# Patient Record
Sex: Female | Born: 1940 | Race: White | Hispanic: No | Smoking: Former smoker
Health system: Southern US, Community
[De-identification: ages and names within clinical notes are randomized; demographics above are authoritative.]

## PROBLEM LIST (undated history)

## (undated) DIAGNOSIS — E785 Hyperlipidemia, unspecified: Secondary | ICD-10-CM

## (undated) DIAGNOSIS — C569 Malignant neoplasm of unspecified ovary: Secondary | ICD-10-CM

## (undated) DIAGNOSIS — I2510 Atherosclerotic heart disease of native coronary artery without angina pectoris: Secondary | ICD-10-CM

## (undated) DIAGNOSIS — C539 Malignant neoplasm of cervix uteri, unspecified: Secondary | ICD-10-CM

## (undated) HISTORY — DX: Malignant neoplasm of cervix uteri, unspecified: C53.9

## (undated) HISTORY — DX: Hyperlipidemia, unspecified: E78.5

## (undated) HISTORY — DX: Atherosclerotic heart disease of native coronary artery without angina pectoris: I25.10

## (undated) HISTORY — DX: Malignant neoplasm of unspecified ovary: C56.9

---

## 1974-09-24 HISTORY — PX: TOTAL ABDOMINAL HYSTERECTOMY: SHX209

## 1999-08-14 ENCOUNTER — Ambulatory Visit (HOSPITAL_COMMUNITY): Admission: RE | Admit: 1999-08-14 | Discharge: 1999-08-14 | Payer: Self-pay | Admitting: Obstetrics & Gynecology

## 2001-07-22 ENCOUNTER — Encounter: Payer: Self-pay | Admitting: Internal Medicine

## 2001-07-22 ENCOUNTER — Ambulatory Visit (HOSPITAL_COMMUNITY): Admission: RE | Admit: 2001-07-22 | Discharge: 2001-07-22 | Payer: Self-pay | Admitting: Internal Medicine

## 2002-01-13 ENCOUNTER — Ambulatory Visit (HOSPITAL_COMMUNITY): Admission: RE | Admit: 2002-01-13 | Discharge: 2002-01-13 | Payer: Self-pay | Admitting: Internal Medicine

## 2002-01-13 ENCOUNTER — Encounter: Payer: Self-pay | Admitting: Internal Medicine

## 2002-10-14 ENCOUNTER — Ambulatory Visit (HOSPITAL_COMMUNITY): Admission: RE | Admit: 2002-10-14 | Discharge: 2002-10-14 | Payer: Self-pay | Admitting: Internal Medicine

## 2002-10-14 ENCOUNTER — Encounter: Payer: Self-pay | Admitting: Internal Medicine

## 2003-11-17 ENCOUNTER — Ambulatory Visit (HOSPITAL_COMMUNITY): Admission: RE | Admit: 2003-11-17 | Discharge: 2003-11-17 | Payer: Self-pay | Admitting: Internal Medicine

## 2005-02-02 ENCOUNTER — Ambulatory Visit (HOSPITAL_COMMUNITY): Admission: RE | Admit: 2005-02-02 | Discharge: 2005-02-02 | Payer: Self-pay | Admitting: Internal Medicine

## 2005-12-27 ENCOUNTER — Ambulatory Visit (HOSPITAL_COMMUNITY): Admission: RE | Admit: 2005-12-27 | Discharge: 2005-12-27 | Payer: Self-pay | Admitting: Urology

## 2006-02-15 ENCOUNTER — Ambulatory Visit (HOSPITAL_COMMUNITY): Admission: RE | Admit: 2006-02-15 | Discharge: 2006-02-15 | Payer: Self-pay | Admitting: Internal Medicine

## 2006-12-06 ENCOUNTER — Ambulatory Visit (HOSPITAL_COMMUNITY): Admission: RE | Admit: 2006-12-06 | Discharge: 2006-12-06 | Payer: Self-pay | Admitting: Internal Medicine

## 2007-02-18 ENCOUNTER — Ambulatory Visit (HOSPITAL_COMMUNITY): Admission: RE | Admit: 2007-02-18 | Discharge: 2007-02-18 | Payer: Self-pay | Admitting: Internal Medicine

## 2007-12-12 ENCOUNTER — Ambulatory Visit (HOSPITAL_COMMUNITY): Admission: RE | Admit: 2007-12-12 | Discharge: 2007-12-12 | Payer: Self-pay | Admitting: Internal Medicine

## 2008-03-16 ENCOUNTER — Ambulatory Visit (HOSPITAL_COMMUNITY): Admission: RE | Admit: 2008-03-16 | Discharge: 2008-03-16 | Payer: Self-pay | Admitting: Internal Medicine

## 2009-04-07 ENCOUNTER — Ambulatory Visit (HOSPITAL_COMMUNITY): Admission: RE | Admit: 2009-04-07 | Discharge: 2009-04-07 | Payer: Self-pay | Admitting: Internal Medicine

## 2009-09-24 HISTORY — PX: ORIF WRIST FRACTURE: SHX2133

## 2010-03-02 ENCOUNTER — Ambulatory Visit: Payer: Self-pay | Admitting: Orthopedic Surgery

## 2010-03-02 ENCOUNTER — Inpatient Hospital Stay (HOSPITAL_COMMUNITY): Admission: EM | Admit: 2010-03-02 | Discharge: 2010-03-04 | Payer: Self-pay | Admitting: Emergency Medicine

## 2010-03-03 ENCOUNTER — Encounter: Payer: Self-pay | Admitting: Orthopedic Surgery

## 2010-03-04 ENCOUNTER — Encounter: Payer: Self-pay | Admitting: Orthopedic Surgery

## 2010-03-08 ENCOUNTER — Ambulatory Visit: Payer: Self-pay | Admitting: Orthopedic Surgery

## 2010-03-08 DIAGNOSIS — S62109A Fracture of unspecified carpal bone, unspecified wrist, initial encounter for closed fracture: Secondary | ICD-10-CM | POA: Insufficient documentation

## 2010-03-13 ENCOUNTER — Encounter (HOSPITAL_COMMUNITY): Admission: RE | Admit: 2010-03-13 | Discharge: 2010-04-12 | Payer: Self-pay | Admitting: Orthopedic Surgery

## 2010-03-13 ENCOUNTER — Encounter: Payer: Self-pay | Admitting: Infectious Diseases

## 2010-03-15 ENCOUNTER — Encounter: Payer: Self-pay | Admitting: Orthopedic Surgery

## 2010-03-29 ENCOUNTER — Ambulatory Visit: Payer: Self-pay | Admitting: Orthopedic Surgery

## 2010-04-07 ENCOUNTER — Encounter: Payer: Self-pay | Admitting: Thoracic Surgery (Cardiothoracic Vascular Surgery)

## 2010-04-07 ENCOUNTER — Ambulatory Visit: Payer: Self-pay | Admitting: Thoracic Surgery (Cardiothoracic Vascular Surgery)

## 2010-04-07 ENCOUNTER — Inpatient Hospital Stay (HOSPITAL_COMMUNITY): Admission: EM | Admit: 2010-04-07 | Discharge: 2010-04-13 | Payer: Self-pay | Admitting: Cardiovascular Disease

## 2010-04-07 HISTORY — PX: CARDIAC CATHETERIZATION: SHX172

## 2010-04-07 HISTORY — PX: CORONARY ARTERY BYPASS GRAFT: SHX141

## 2010-04-10 ENCOUNTER — Telehealth: Payer: Self-pay | Admitting: Orthopedic Surgery

## 2010-04-27 ENCOUNTER — Ambulatory Visit: Payer: Self-pay | Admitting: Orthopedic Surgery

## 2010-05-01 ENCOUNTER — Ambulatory Visit: Payer: Self-pay | Admitting: Thoracic Surgery (Cardiothoracic Vascular Surgery)

## 2010-05-01 ENCOUNTER — Encounter
Admission: RE | Admit: 2010-05-01 | Discharge: 2010-05-01 | Payer: Self-pay | Admitting: Thoracic Surgery (Cardiothoracic Vascular Surgery)

## 2010-05-31 ENCOUNTER — Ambulatory Visit: Payer: Self-pay | Admitting: Orthopedic Surgery

## 2010-06-12 ENCOUNTER — Encounter (HOSPITAL_COMMUNITY): Admission: RE | Admit: 2010-06-12 | Discharge: 2010-06-23 | Payer: Self-pay | Admitting: Cardiovascular Disease

## 2010-06-24 ENCOUNTER — Encounter (HOSPITAL_COMMUNITY)
Admission: RE | Admit: 2010-06-24 | Discharge: 2010-07-24 | Payer: Self-pay | Source: Home / Self Care | Admitting: Cardiovascular Disease

## 2010-07-26 ENCOUNTER — Encounter (HOSPITAL_COMMUNITY)
Admission: RE | Admit: 2010-07-26 | Discharge: 2010-08-25 | Payer: Self-pay | Source: Home / Self Care | Admitting: Cardiovascular Disease

## 2010-08-25 ENCOUNTER — Encounter (HOSPITAL_COMMUNITY)
Admission: RE | Admit: 2010-08-25 | Discharge: 2010-09-24 | Payer: Self-pay | Source: Home / Self Care | Attending: Cardiovascular Disease | Admitting: Cardiovascular Disease

## 2010-08-31 ENCOUNTER — Ambulatory Visit: Payer: Self-pay | Admitting: Orthopedic Surgery

## 2010-09-09 IMAGING — CR DG CHEST 1V PORT
1 series · 1 of 1 positions shown · non-contrast
Comparison: 04/09/2010

CLINICAL DATA: Post CABG

PORTABLE CHEST - 1 VIEW

[AP]
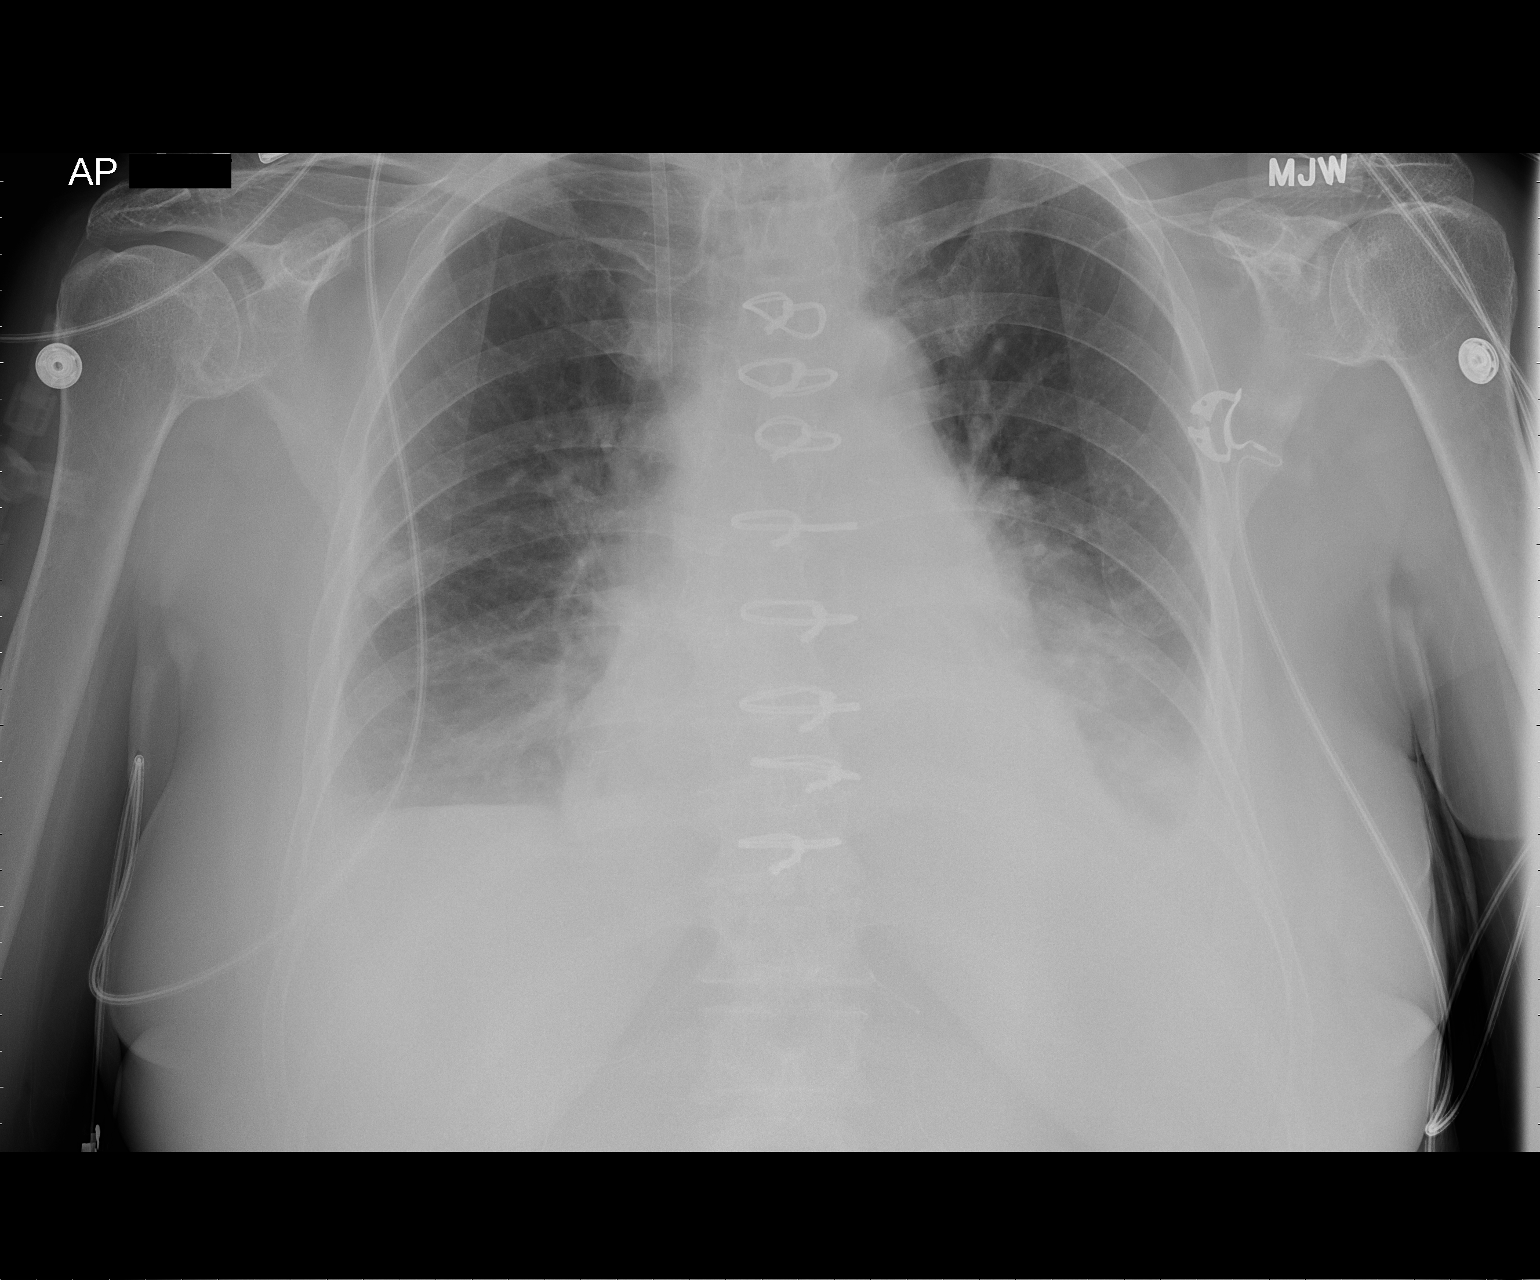

[1 of 1 positions shown; findings below may reference images not displayed]

FINDINGS: Heart size within normal limits.  No significant vascular
congestion.  No pneumothorax.  Bibasilar atelectasis and pleural
reaction about the same.  Overall, no significant change.
IMPRESSION: Postoperative atelectasis - no significant change.

## 2010-09-10 IMAGING — CR DG CHEST 2V
2 series · 2 of 2 positions shown · non-contrast
Comparison: 04/10/2010 and earlier.

CLINICAL DATA: 69-year-old female with ST elevation myocardial
infarction.

CHEST - 2 VIEW

[w chest pa]
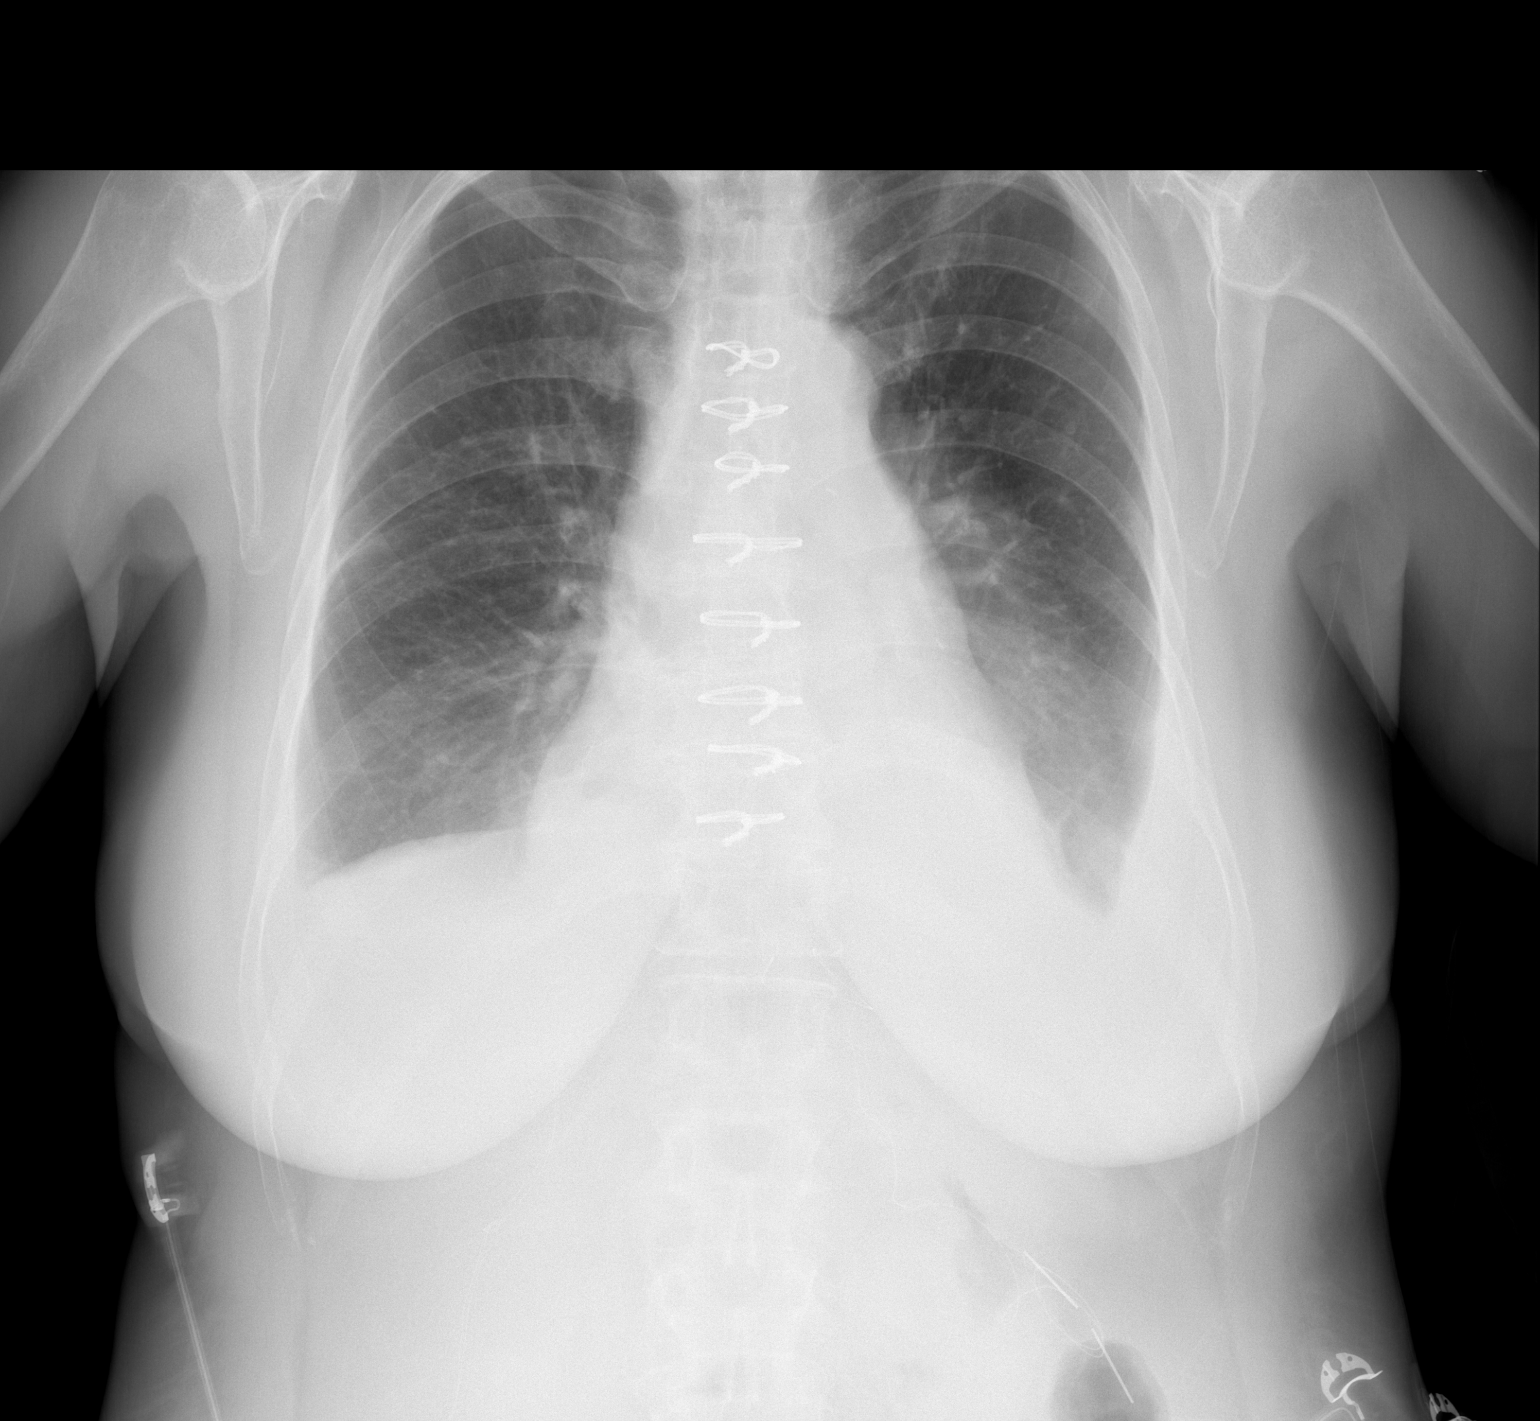

[w chest lat]
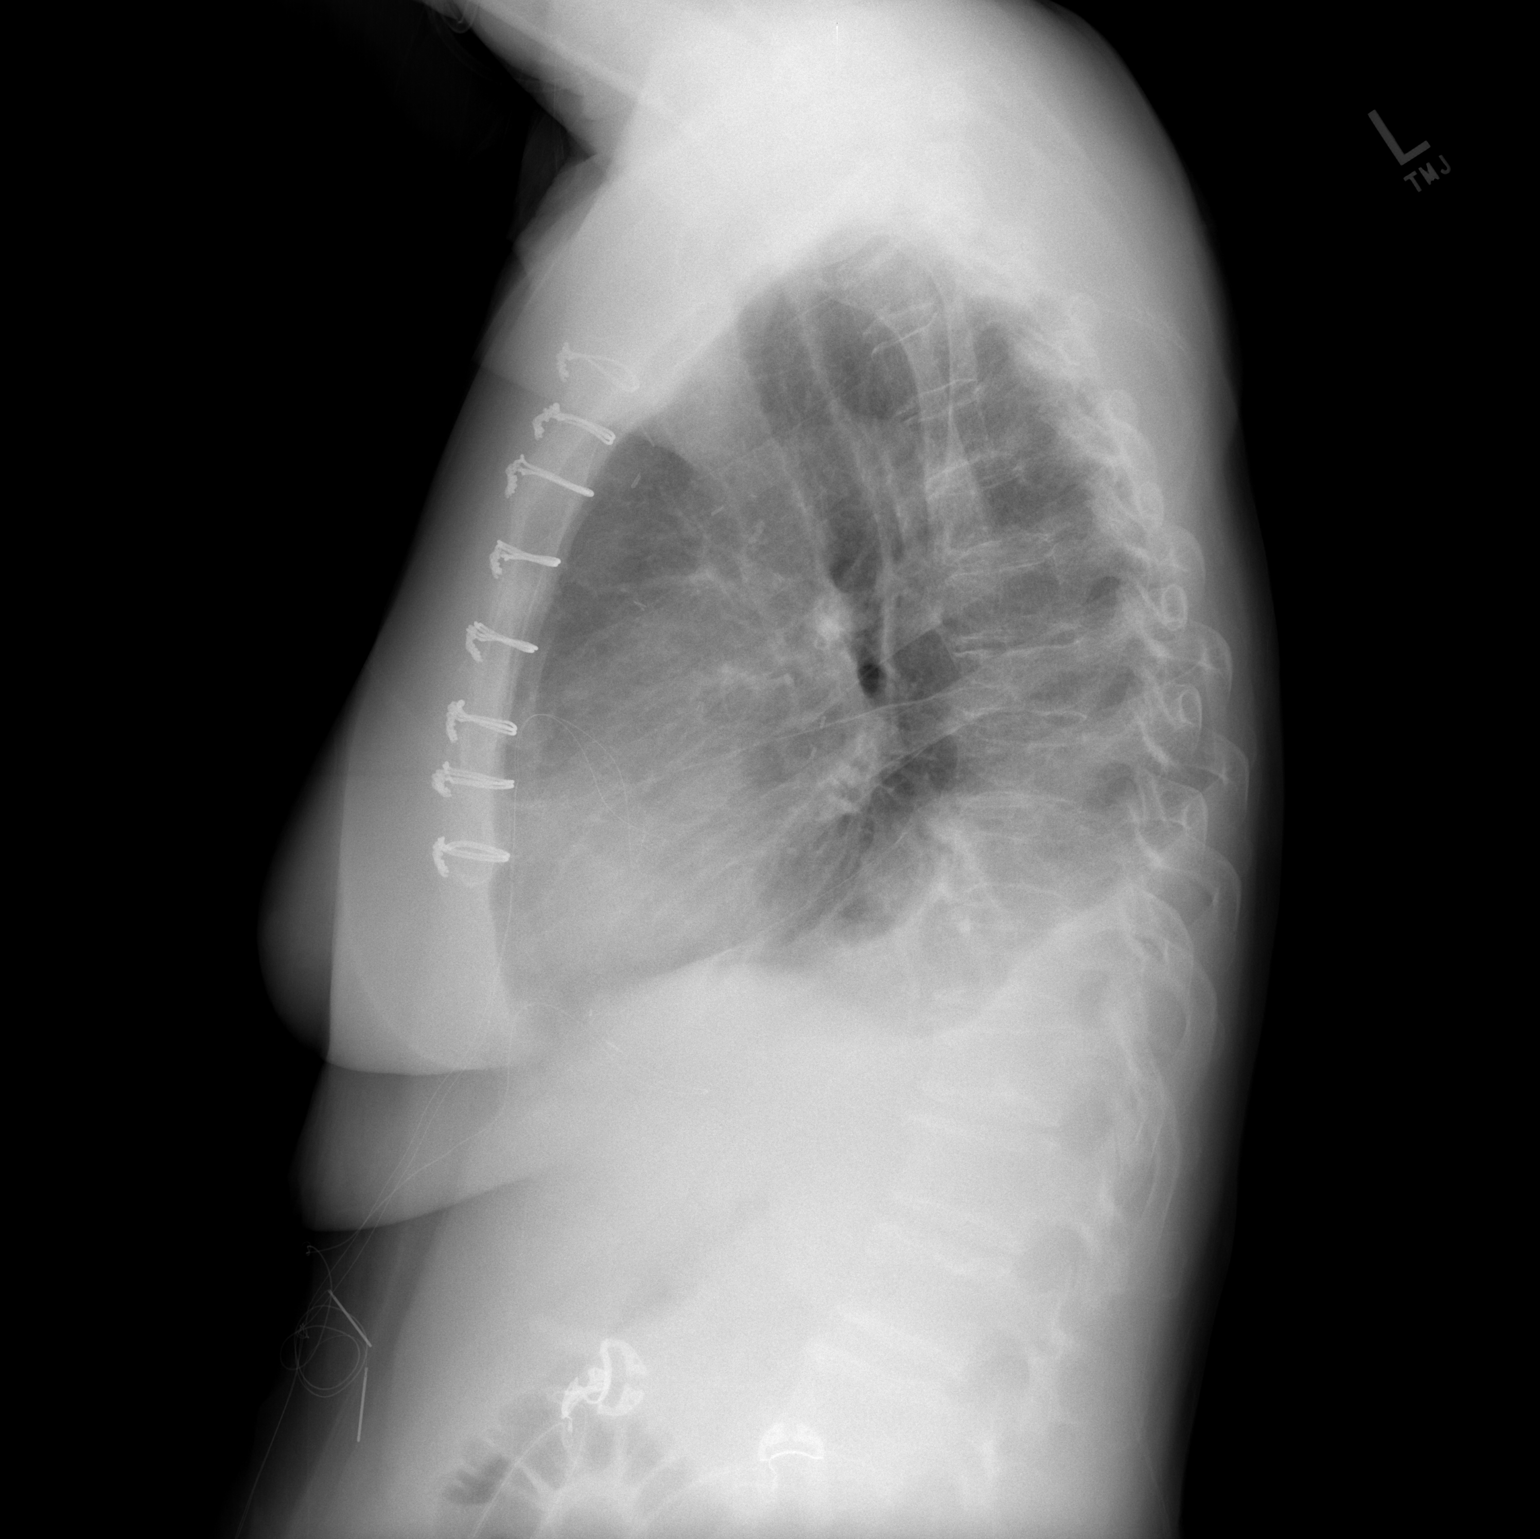

[2 of 2 positions shown; findings below may reference images not displayed]

FINDINGS: Right IJ introducer sheath removed.  A small moderate
bilateral pleural effusions.  Bilateral lower lobe collapse or
consolidation.  Stable cardiac size and mediastinal contours.  No
pneumothorax or pulmonary edema. Stable visualized osseous
structures.
IMPRESSION: Bilateral pleural effusions with lower lobe collapse or
consolidation.

## 2010-10-24 NOTE — Progress Notes (Signed)
Summary: patient in the hospital  Phone Note Other Incoming   Summary of Call: Amy Finley (10/13/2040) [name], [place] boyfriend came by to cancel Amy Finley's appointment for 04/13/10.  Said she has had a heart attack, is in the hospital and will reschedule when she is able to come in. Initial call taken by: Jacklynn Ganong,  April 10, 2010 11:36 AM

## 2010-10-24 NOTE — Letter (Signed)
Summary: Hospital discharge instructions  Hospital discharge instructions   Imported By: Jacklynn Ganong 03/14/2010 10:18:51  _____________________________________________________________________  External Attachment:    Type:   Image     Comment:   External Document

## 2010-10-24 NOTE — Assessment & Plan Note (Signed)
Summary: HOSP FOL-UP POST OP #1 LEFT WRIST/[facility]   Visit Type:  Follow-up  CC:  post op 1 left wrist.  History of Present Illness: I saw Amy Finley in the office today for a followup visit.  She is a 70 years old woman with the complaint of:  fu from hospital, left wrist.  DOS 03/03/10 otif, left wrist with DVR plate. and some additional bone graft  DOI 03/02/10.  POD 5 dressing change and application of splint.  Meds: Norco 5 for pain and Ibuprofen 400, does not need Norco.  doing well wound looks good. Her hand looks good. Recommend resting splint followup for staple removal. After that 2 weeks later. X-ray wrist   Impression & Recommendations:  Problem # 1:  CLOSED FRACTURE OF OTHER BONE OF WRIST (ICD-814.09) Assessment Comment Only  Orders: Physical Therapy Referral (PT) Post-Op Check (33295)  Patient Instructions: 1)  hand splint at [facility] 2)  return on 22nd staple removal  [then f/u 2 weeks after that forxrays]

## 2010-10-24 NOTE — Miscellaneous (Signed)
Summary: came in for staple removal  Clinical Lists Changes  patient came in had staples removed, post op wrist, no swelling,no redness, no drainage, still no pain, has volar splint on, no problems, pt will come back 03/29/10 845 for appt with xrays left wrist.

## 2010-10-24 NOTE — Assessment & Plan Note (Signed)
Summary: RE-CK/XRAY LT WRIST/POST OP 03/03/10/[facility]   Visit Type:  Follow-up  CC:  left wrist fracture.  History of Present Illness: 70 year old female postop day number 56 [ 8 weeks]  DOS 03/03/10 otif, left wrist with DVR plate. and some additional bone graft  DOI 03/02/10.  ROS: had CABG AND MI SINCE WE SAW HER !  OTHERWISE SHE IS DOING WELL   I would like to see some more wrist extension   Xrays show  alignment nearly anatomic  hardware position is great   OT at home  f/u in 1 month      Other Orders: Post-Op Check (98119)  Patient Instructions: 1)  You are doing good 2)  xrays look good 3)  Keep doing wrist exercises 4)  1 month check ROM

## 2010-10-24 NOTE — Assessment & Plan Note (Signed)
Summary: RE-CK/XRAYS LT WRIST/POST OP 03/03/10/[facility]   Visit Type:  Follow-up  CC:  post op fx care lft wrist.  History of Present Illness: I saw Amy Finley in the office today for a followup visit.  She is a 70 years old woman with the complaint of:  fu from hospital, left wrist.  DOS 03/03/10 otif, left wrist with DVR plate. and some additional bone graft  DOI 03/02/10.  POD 26  Today is 2 week recheck xray left wrist after removable splint application.  No pain, only some swelling.  Ibuprofen at night.  Had dissolvable suture showing, so it was clipped today.  She is doing well only takes ibuprofen at night x-rays show slight change in position of the fracture vs. the initial reduction in the OR but no change from the final reduction in the OR.  Otherwise skin looks good hand function is very good with full flexion of the MP joints and IP joints.  We did apply some bone graft followed like her in her splint for 6 weeks total        Impression & Recommendations:  Problem # 1:  CLOSED FRACTURE OF OTHER BONE OF WRIST (ICD-814.09)  2 views LEFT wrist fracture fixation with a DVR plate hole is also fracture but well aligned the hardware and overall fracture alignment looks acceptable  Orders: Post-Op Check (45409) Wrist x-ray complete, minimum 3 views (81191)  Patient Instructions: 1)  21st of July xrays again  2)  wear splint move fingers

## 2010-10-24 NOTE — Assessment & Plan Note (Signed)
Summary: 3 M RE-CK/POST OP WRIST SURG 02/1010/[facility]    Visit Type:  Follow-up  CC:  RECHECK WRIST.  History of Present Illness: I saw Amy Finley in the office today for a 1 month followup visit.  She is a 70 years old woman with the complaint of:  left wrist.  Check ROM. at 6 months post op  DOS 03/03/10 otif, left wrist with DVR plate. and some additional bone graft  DOI 03/02/10.  Doing wonderful.  No meds needed for pain.  Ecotrin 325 per day, Coreg, Crestor.  She has near normal flexion, extension compared to her opposite side. She has normal rotation and pronation, supination. No pain a well-healed scar. Excellent grip strength. She is advised to do activities as tolerated and follow up as needed    Other Orders: Est. Patient Level II (16109)  Patient Instructions: 1)  Please schedule a follow-up appointment as needed.   Orders Added: 1)  Est. Patient Level II [60454]

## 2010-10-24 NOTE — Letter (Signed)
Summary: Discharge Summary  Discharge Summary   Imported By: Cammie Sickle 03/08/2010 14:11:56  _____________________________________________________________________  External Attachment:    Type:   Image     Comment:   External Document

## 2010-10-24 NOTE — Op Note (Signed)
Summary: Operative Rpt LTwrist OTIF  Operative Rpt LTwrist OTIF   Imported By: Cammie Sickle 03/08/2010 14:10:04  _____________________________________________________________________  External Attachment:    Type:   Image     Comment:   External Document

## 2010-10-24 NOTE — Assessment & Plan Note (Signed)
Summary: 1 M RE-CK ROM WRIST/POST OP 03/03/10/[facility]   Visit Type:  Follow-up  CC:  left wrist.  History of Present Illness: I saw Amy Finley in the office today for a 1 month followup visit.  She is a 70 years old woman with the complaint of:  left wrist.  Check ROM.  DOS 03/03/10 otif, left wrist with DVR plate. and some additional bone graft  DOI 03/02/10.  It is now 3 months since her DVR plate and she's doing well except for a slight loss of extension about 10.  She has excellent supination pronation no pain the incision looks good her finger flexion is normal.  She has good grip strength.  Recommend work on exercises including the hand to hand wrist extension exercise followup for final check in 3 months    Other Orders: Post-Op Check (16109)  Patient Instructions: 1)  Your hand and wrist motion is good 2)  Keep working on the exercises 3)  come back in 3 months

## 2010-10-24 NOTE — Miscellaneous (Signed)
Summary: OT clinical evaluation  OT clinical evaluation   Imported By: Jacklynn Ganong 03/31/2010 07:40:18  _____________________________________________________________________  External Attachment:    Type:   Image     Comment:   External Document

## 2010-12-09 LAB — CBC
HCT: 31 % — ABNORMAL LOW (ref 36.0–46.0)
HCT: 32.8 % — ABNORMAL LOW (ref 36.0–46.0)
Hemoglobin: 10.8 g/dL — ABNORMAL LOW (ref 12.0–15.0)
Hemoglobin: 11.3 g/dL — ABNORMAL LOW (ref 12.0–15.0)
Hemoglobin: 11.4 g/dL — ABNORMAL LOW (ref 12.0–15.0)
MCH: 29.5 pg (ref 26.0–34.0)
MCH: 30.1 pg (ref 26.0–34.0)
MCH: 30.2 pg (ref 26.0–34.0)
MCH: 30.5 pg (ref 26.0–34.0)
MCH: 30.9 pg (ref 26.0–34.0)
MCHC: 34.4 g/dL (ref 30.0–36.0)
MCHC: 34.5 g/dL (ref 30.0–36.0)
MCHC: 34.8 g/dL (ref 30.0–36.0)
MCV: 87.7 fL (ref 78.0–100.0)
MCV: 87.8 fL (ref 78.0–100.0)
MCV: 89.1 fL (ref 78.0–100.0)
Platelets: 205 10*3/uL (ref 150–400)
Platelets: 224 10*3/uL (ref 150–400)
Platelets: 243 10*3/uL (ref 150–400)
Platelets: 257 10*3/uL (ref 150–400)
RBC: 2.12 MIL/uL — ABNORMAL LOW (ref 3.87–5.11)
RBC: 3.57 MIL/uL — ABNORMAL LOW (ref 3.87–5.11)
RBC: 4.37 MIL/uL (ref 3.87–5.11)
RDW: 14 % (ref 11.5–15.5)
RDW: 14.9 % (ref 11.5–15.5)
RDW: 15 % (ref 11.5–15.5)
WBC: 10.1 10*3/uL (ref 4.0–10.5)
WBC: 10.2 10*3/uL (ref 4.0–10.5)
WBC: 7.9 10*3/uL (ref 4.0–10.5)

## 2010-12-09 LAB — TYPE AND SCREEN: ABO/RH(D): O POS

## 2010-12-09 LAB — POCT I-STAT, CHEM 8
BUN: 14 mg/dL (ref 6–23)
Calcium, Ion: 1.04 mmol/L — ABNORMAL LOW (ref 1.12–1.32)
Calcium, Ion: 1.13 mmol/L (ref 1.12–1.32)
Chloride: 104 mEq/L (ref 96–112)
Creatinine, Ser: 0.7 mg/dL (ref 0.4–1.2)
Creatinine, Ser: 0.8 mg/dL (ref 0.4–1.2)
Glucose, Bld: 119 mg/dL — ABNORMAL HIGH (ref 70–99)
HCT: 35 % — ABNORMAL LOW (ref 36.0–46.0)
HCT: 37 % (ref 36.0–46.0)
Hemoglobin: 11.9 g/dL — ABNORMAL LOW (ref 12.0–15.0)
Potassium: 4 mEq/L (ref 3.5–5.1)
TCO2: 27 mmol/L (ref 0–100)

## 2010-12-09 LAB — PREPARE PLATELETS

## 2010-12-09 LAB — PREPARE FRESH FROZEN PLASMA

## 2010-12-09 LAB — POCT I-STAT 3, ART BLOOD GAS (G3+)
Acid-Base Excess: 1 mmol/L (ref 0.0–2.0)
Acid-Base Excess: 1 mmol/L (ref 0.0–2.0)
Bicarbonate: 23.1 mEq/L (ref 20.0–24.0)
Bicarbonate: 24 mEq/L (ref 20.0–24.0)
Bicarbonate: 24.8 mEq/L — ABNORMAL HIGH (ref 20.0–24.0)
Bicarbonate: 25.9 mEq/L — ABNORMAL HIGH (ref 20.0–24.0)
Bicarbonate: 26.6 mEq/L — ABNORMAL HIGH (ref 20.0–24.0)
O2 Saturation: 99 %
Patient temperature: 35.7
Patient temperature: 36.9
Patient temperature: 37.1
TCO2: 25 mmol/L (ref 0–100)
TCO2: 27 mmol/L (ref 0–100)
TCO2: 28 mmol/L (ref 0–100)
pCO2 arterial: 37 mmHg (ref 35.0–45.0)
pCO2 arterial: 40.2 mmHg (ref 35.0–45.0)
pH, Arterial: 7.342 — ABNORMAL LOW (ref 7.350–7.400)
pH, Arterial: 7.408 — ABNORMAL HIGH (ref 7.350–7.400)
pH, Arterial: 7.409 — ABNORMAL HIGH (ref 7.350–7.400)
pH, Arterial: 7.416 — ABNORMAL HIGH (ref 7.350–7.400)
pH, Arterial: 7.417 — ABNORMAL HIGH (ref 7.350–7.400)
pH, Arterial: 7.419 — ABNORMAL HIGH (ref 7.350–7.400)
pH, Arterial: 7.424 — ABNORMAL HIGH (ref 7.350–7.400)
pO2, Arterial: 305 mmHg — ABNORMAL HIGH (ref 80.0–100.0)
pO2, Arterial: 463 mmHg — ABNORMAL HIGH (ref 80.0–100.0)
pO2, Arterial: 547 mmHg — ABNORMAL HIGH (ref 80.0–100.0)
pO2, Arterial: 74 mmHg — ABNORMAL LOW (ref 80.0–100.0)
pO2, Arterial: 85 mmHg (ref 80.0–100.0)

## 2010-12-09 LAB — GLUCOSE, CAPILLARY
Glucose-Capillary: 114 mg/dL — ABNORMAL HIGH (ref 70–99)
Glucose-Capillary: 116 mg/dL — ABNORMAL HIGH (ref 70–99)
Glucose-Capillary: 131 mg/dL — ABNORMAL HIGH (ref 70–99)
Glucose-Capillary: 134 mg/dL — ABNORMAL HIGH (ref 70–99)
Glucose-Capillary: 139 mg/dL — ABNORMAL HIGH (ref 70–99)
Glucose-Capillary: 143 mg/dL — ABNORMAL HIGH (ref 70–99)
Glucose-Capillary: 156 mg/dL — ABNORMAL HIGH (ref 70–99)
Glucose-Capillary: 81 mg/dL (ref 70–99)
Glucose-Capillary: 98 mg/dL (ref 70–99)

## 2010-12-09 LAB — BASIC METABOLIC PANEL
BUN: 13 mg/dL (ref 6–23)
BUN: 15 mg/dL (ref 6–23)
BUN: 9 mg/dL (ref 6–23)
CO2: 28 mEq/L (ref 19–32)
CO2: 29 mEq/L (ref 19–32)
CO2: 29 mEq/L (ref 19–32)
Calcium: 8.2 mg/dL — ABNORMAL LOW (ref 8.4–10.5)
Calcium: 8.4 mg/dL (ref 8.4–10.5)
Chloride: 103 mEq/L (ref 96–112)
Chloride: 103 mEq/L (ref 96–112)
Creatinine, Ser: 0.67 mg/dL (ref 0.4–1.2)
Creatinine, Ser: 0.7 mg/dL (ref 0.4–1.2)
GFR calc Af Amer: >60 mL/min (ref >60)
GFR calc Af Amer: >60 mL/min (ref >60)
GFR calc Af Amer: >60 mL/min (ref >60)
GFR calc non Af Amer: >60 mL/min (ref >60)
GFR calc non Af Amer: >60 mL/min (ref >60)
GFR calc non Af Amer: >60 mL/min (ref >60)
GFR calc non Af Amer: >60 mL/min (ref >60)
Glucose, Bld: 128 mg/dL — ABNORMAL HIGH (ref 70–99)
Glucose, Bld: 138 mg/dL — ABNORMAL HIGH (ref 70–99)
Glucose, Bld: 150 mg/dL — ABNORMAL HIGH (ref 70–99)
Potassium: 3.3 mEq/L — ABNORMAL LOW (ref 3.5–5.1)
Sodium: 141 mEq/L (ref 135–145)
Sodium: 142 mEq/L (ref 135–145)

## 2010-12-09 LAB — POCT I-STAT 4, (NA,K, GLUC, HGB,HCT)
Glucose, Bld: 149 mg/dL — ABNORMAL HIGH (ref 70–99)
Glucose, Bld: 158 mg/dL — ABNORMAL HIGH (ref 70–99)
HCT: 15 % — ABNORMAL LOW (ref 36.0–46.0)
HCT: 21 % — ABNORMAL LOW (ref 36.0–46.0)
HCT: 26 % — ABNORMAL LOW (ref 36.0–46.0)
Hemoglobin: 11.6 g/dL — ABNORMAL LOW (ref 12.0–15.0)
Hemoglobin: 5.1 g/dL — CL (ref 12.0–15.0)
Hemoglobin: 7.1 g/dL — ABNORMAL LOW (ref 12.0–15.0)
Hemoglobin: 7.5 g/dL — ABNORMAL LOW (ref 12.0–15.0)
Potassium: 2.8 mEq/L — ABNORMAL LOW (ref 3.5–5.1)
Potassium: 4.2 mEq/L (ref 3.5–5.1)
Potassium: 4.2 mEq/L (ref 3.5–5.1)
Potassium: 4.4 mEq/L (ref 3.5–5.1)
Potassium: 6.4 mEq/L (ref 3.5–5.1)
Potassium: 7.2 mEq/L (ref 3.5–5.1)
Sodium: 136 mEq/L (ref 135–145)

## 2010-12-09 LAB — MRSA PCR SCREENING: MRSA by PCR: NEGATIVE

## 2010-12-09 LAB — PREPARE CRYOPRECIPITATE

## 2010-12-09 LAB — POCT I-STAT 3, VENOUS BLOOD GAS (G3P V)
Acid-base deficit: 4 mmol/L — ABNORMAL HIGH (ref 0.0–2.0)
Bicarbonate: 21.9 mEq/L (ref 20.0–24.0)
Bicarbonate: 26.3 mEq/L — ABNORMAL HIGH (ref 20.0–24.0)
O2 Saturation: 100 %
TCO2: 27 mmol/L (ref 0–100)
pCO2, Ven: 40.3 mmHg — ABNORMAL LOW (ref 45.0–50.0)
pO2, Ven: 430 mmHg — ABNORMAL HIGH (ref 30.0–45.0)
pO2, Ven: 47 mmHg — ABNORMAL HIGH (ref 30.0–45.0)

## 2010-12-09 LAB — COMPREHENSIVE METABOLIC PANEL
ALT: 19 U/L (ref 0–35)
AST: 20 U/L (ref 0–37)
Albumin: 3.5 g/dL (ref 3.5–5.2)
Alkaline Phosphatase: 59 U/L (ref 39–117)
CO2: 23 mEq/L (ref 19–32)
Calcium: 7.7 mg/dL — ABNORMAL LOW (ref 8.4–10.5)
Creatinine, Ser: 0.71 mg/dL (ref 0.4–1.2)
Potassium: 3.9 mEq/L (ref 3.5–5.1)
Total Protein: 5.2 g/dL — ABNORMAL LOW (ref 6.0–8.3)

## 2010-12-09 LAB — CREATININE, SERUM
Creatinine, Ser: 0.77 mg/dL (ref 0.4–1.2)
GFR calc Af Amer: >60 mL/min (ref >60)

## 2010-12-09 LAB — PLATELET INHIBITION P2Y12
P2Y12 % Inhibition: 37 %
Platelet Function  P2Y12: 272 [PRU] (ref 194–418)
Platelet Function Baseline: 434 [PRU] — ABNORMAL HIGH (ref 194–418)

## 2010-12-09 LAB — PROTIME-INR
INR: 4.54 — ABNORMAL HIGH (ref 0.00–1.49)
Prothrombin Time: 10.4 seconds — ABNORMAL LOW (ref 11.6–15.2)

## 2010-12-09 LAB — APTT: aPTT: 29 seconds (ref 24–37)

## 2010-12-09 LAB — MAGNESIUM: Magnesium: 3 mg/dL — ABNORMAL HIGH (ref 1.5–2.5)

## 2010-12-09 LAB — HEMOGLOBIN A1C
Hgb A1c MFr Bld: 5.5 % (ref <5.7)
Mean Plasma Glucose: 111 mg/dL (ref <117)

## 2010-12-09 LAB — PLATELET COUNT: Platelets: 142 10*3/uL — ABNORMAL LOW (ref 150–400)

## 2010-12-09 LAB — HEMOGLOBIN AND HEMATOCRIT, BLOOD: HCT: 27.5 % — ABNORMAL LOW (ref 36.0–46.0)

## 2010-12-11 LAB — DIFFERENTIAL
Eosinophils Relative: 0 % (ref 0–5)
Lymphocytes Relative: 10 % — ABNORMAL LOW (ref 12–46)
Lymphs Abs: 1.4 10*3/uL (ref 0.7–4.0)
Monocytes Absolute: 0.9 10*3/uL (ref 0.1–1.0)
Monocytes Relative: 7 % (ref 3–12)

## 2010-12-11 LAB — BASIC METABOLIC PANEL
GFR calc non Af Amer: >60 mL/min (ref >60)
Glucose, Bld: 150 mg/dL — ABNORMAL HIGH (ref 70–99)
Potassium: 4 mEq/L (ref 3.5–5.1)
Sodium: 137 mEq/L (ref 135–145)

## 2010-12-11 LAB — CBC
HCT: 40.8 % (ref 36.0–46.0)
Hemoglobin: 13.9 g/dL (ref 12.0–15.0)
RBC: 4.65 MIL/uL (ref 3.87–5.11)
WBC: 13.1 10*3/uL — ABNORMAL HIGH (ref 4.0–10.5)

## 2011-02-06 NOTE — Assessment & Plan Note (Signed)
OFFICE VISIT   SHIRL, WEIR  DOB:  January 19, 1941                                        May 01, 2010  CHART #:  [id number]   HISTORY OF PRESENT ILLNESS:  The patient returns for routine followup  status post emergency coronary artery bypass grafting x2 on April 07, 2010.  Her postoperative recovery has been remarkably uncomplicated.  Since hospital discharge, she has continued to do very well.  She has  not yet seen Dr. Allyson Sabal in followup, but she plans to see him tomorrow.  She reports that since hospital discharge, she has continued to improve  every day.  She has minimal residual soreness in her chest.  She has no  shortness of breath.  Her activity level has increased nicely.  She  plans to start the cardiac rehab program next week.  Her appetite is  good and she is sleeping well at night.  She has no complaints.   CURRENT MEDICATIONS:  Aspirin, metoprolol, Crestor, calcium with vitamin  D, Ester-C, multivitamin, vitamin B complex, and losartan.   PHYSICAL EXAMINATION:  Notable for well-appearing female with blood  pressure 119/77, pulse 96, and oxygen saturation 94% on room air.  Examination of the chest reveals a well-healed median sternotomy  incision.  The sternum is stable on palpation.  Auscultation of the  chest reveals clear breath sounds that are symmetrical bilaterally.  No  wheezes, rales, or rhonchi are noted.  Cardiovascular exam is notable  for regular rate and rhythm.  No murmurs, rubs, or gallops are  identified.  The abdomen is soft and nontender.  The extremities are  warm and well perfused.  There is no lower extremity edema.  The small  incision from endoscopic vein harvest just below the right knee has  healed nicely.  The remainder of her physical exam is unremarkable.   DIAGNOSTIC TESTS:  Chest x-ray performed today at the [facility] is reviewed.  This demonstrates clear lung fields bilaterally  with exception  of very small bilateral pleural effusions.  These have  decreased in size in comparison to her last previous x-ray.  All the  sternal wires appear intact.  No other abnormalities are noted.   IMPRESSION:  The patient appears to be doing extremely well just less  than 1 month following her surgery.   PLAN:  I have encouraged the patient to continue to increase her  physical activity as tolerated with her only limitation at this point  remaining that she refrain from heavy lifting or strenuous use of her  arms or shoulders for at least another 2 months.  I have encouraged her  to get involved in the cardiac rehab program as she plans to do.  We  have reminded her how important it will remain for her to continue to  abstain from any tobacco use.  All of her questions have been addressed.  We have not made any changes in her medications.  In the future, the  patient will call and return to see [place] here as needed.   Salvatore Decent. Cornelius Moras, M.D.  Electronically Signed   CHO/MEDQ  D:  05/01/2010  T:  05/02/2010  Job:  [id number]   cc:   Nanetta Batty, M.D.  Kingsley Callander. Ouida Sills, MD

## 2011-02-09 NOTE — Op Note (Signed)
[facility]  Patient:    Amy Finley, Amy Finley Visit Number: [id number] MRN: [id number]          Service Type: END Location: DAY Attending Physician:  Jonathon Bellows Dictated by:   Roetta Sessions, M.D. Proc. Date: 01/13/02 Admit Date:  01/13/2002   CC:         Carylon Perches, M.D.   Operative Report  PROCEDURE:  Incomplete colonoscopy.  INDICATIONS FOR PROCEDURE:  The patient is a 70 year old lady for colon cancer screening. She had a sigmoidoscopy a few years back. She has no bowel symptoms. She has a history of ovarian cancer per reports status post oophorectomy, hysterectomy. Colonoscopy is now being as screening. This approach has been with [name] at the bedside. The potential risks, benefits, and alternatives have been reviewed, questions answered. She is low risk for conscious sedation. Please see my handwritten H&P.  PROCEDURE NOTE:  O2 saturations, blood pressure, pulse and respirations were monitored throughout the entire procedure. The patient placed in the left lateral decubitus position. Conscious sedation Versed 3 mg IV, Demerol 75 mg IV in divided doses. Instruments Olympus video adult and pediatric colonoscope.  FINDINGS:  Digital rectal exam revealed no abnormalities.  ENDOSCOPIC FINDINGS:  The prep was good.  RECTUM:  Examination of rectal mucosa including the retroflexed view of the anal verge revealed no internal hemorrhoids.  COLON:  Colonic mucosa surveyed from the rectosigmoid junction to 45 cm. At 45 cm, there was acute flexure in the colon. I was unable to overcome that with the adult scope in spite of changing the patients position and external abdominal pressure. I withdrew the scope and obtained the pediatric colonoscope. Likewise, I spent some time trying to negotiate this acute flexure. The colon was noncompliant at this level. I was unable to overcome the flexure to complete the exam. The patient tolerated the procedure  well and was reacted in endoscopy.  IMPRESSION:  1. Internal hemorrhoids otherwise normal rectum.  2. Normal left colon to 45 cm. Exam not completed for reasons outlined     above.  RECOMMENDATIONS:  Will compliment todays study with an air contrast barium enema. Further recommendations to follow. Dictated by:   Roetta Sessions, M.D. Attending Physician:  Jonathon Bellows DD:  01/13/02 TD:  01/13/02 Job: [id number]

## 2011-08-21 ENCOUNTER — Other Ambulatory Visit (HOSPITAL_COMMUNITY): Payer: Self-pay | Admitting: Internal Medicine

## 2011-08-21 DIAGNOSIS — Z139 Encounter for screening, unspecified: Secondary | ICD-10-CM

## 2011-08-28 ENCOUNTER — Ambulatory Visit (HOSPITAL_COMMUNITY)
Admission: RE | Admit: 2011-08-28 | Discharge: 2011-08-28 | Disposition: A | Discharge status: Home / Self Care | Payer: 59 | Source: Ambulatory Visit | Attending: Internal Medicine | Admitting: Internal Medicine

## 2011-08-28 DIAGNOSIS — Z139 Encounter for screening, unspecified: Secondary | ICD-10-CM

## 2011-08-28 DIAGNOSIS — Z1231 Encounter for screening mammogram for malignant neoplasm of breast: Secondary | ICD-10-CM | POA: Insufficient documentation

## 2012-08-07 ENCOUNTER — Other Ambulatory Visit (HOSPITAL_COMMUNITY): Payer: Self-pay | Admitting: Internal Medicine

## 2012-08-07 DIAGNOSIS — Z139 Encounter for screening, unspecified: Secondary | ICD-10-CM

## 2012-09-08 ENCOUNTER — Ambulatory Visit (HOSPITAL_COMMUNITY)
Admission: RE | Admit: 2012-09-08 | Discharge: 2012-09-08 | Disposition: A | Discharge status: Home / Self Care | Payer: 59 | Source: Ambulatory Visit | Attending: Internal Medicine | Admitting: Internal Medicine

## 2012-09-08 DIAGNOSIS — Z139 Encounter for screening, unspecified: Secondary | ICD-10-CM

## 2012-09-08 DIAGNOSIS — Z1231 Encounter for screening mammogram for malignant neoplasm of breast: Secondary | ICD-10-CM | POA: Insufficient documentation

## 2013-02-03 ENCOUNTER — Encounter: Payer: Self-pay | Admitting: Cardiovascular Disease

## 2013-02-12 ENCOUNTER — Ambulatory Visit: Payer: 59 | Admitting: Cardiovascular Disease

## 2013-02-13 ENCOUNTER — Encounter: Payer: Self-pay | Admitting: Cardiovascular Disease

## 2013-02-13 ENCOUNTER — Ambulatory Visit (INDEPENDENT_AMBULATORY_CARE_PROVIDER_SITE_OTHER): Payer: 59 | Admitting: Cardiovascular Disease

## 2013-02-13 VITALS — BP 124/72 | HR 64 | Ht 63.0 in | Wt 153.0 lb

## 2013-02-13 DIAGNOSIS — I2510 Atherosclerotic heart disease of native coronary artery without angina pectoris: Secondary | ICD-10-CM

## 2013-02-13 NOTE — Patient Instructions (Addendum)
Your physician recommends that you schedule a follow-up appointment in: 6 months in [place], someone will call you to schedule an appointment

## 2013-02-13 NOTE — Assessment & Plan Note (Signed)
Stable CAD

## 2013-02-13 NOTE — Progress Notes (Signed)
02/13/2013 Amy Finley   06/27/1941  [id number]  Primary Physician Carylon Perches, MD Primary Cardiologist: Runell Gess MD [name]   HPI:  The patient is a very pleasant 72 year old, thin-appearing, married Caucasian female, mother of 2 children who I last saw 6 months ago. She has a history of ST-segment-elevation myocardial infarction treated with PCI and stenting of her apical LAD which is complicated by a left main dissection requiring emergency coronary artery bypass grafting x2 by Dr. Tressie Stalker April 07, 2010. She had a vein to her LAD and circumflex. 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 Myoview showed scar in the LAD territory. She has been completely asymptomatic. Her most recent lab work performed in April revealed total cholesterol 148, LDL 69, HDL of 61.      Current Outpatient Prescriptions  Medication Sig Dispense Refill  . aspirin EC 81 MG tablet Take 81 mg by mouth daily.      . B Complex-Biotin-FA (VITAMIN B50 COMPLEX PO) Take 1 capsule by mouth daily.      . carvedilol (COREG) 3.125 MG tablet Take 3.125 mg by mouth. Take 1/2 tablet twice a day      . Cholecalciferol (VITAMIN D-3) 1000 UNITS CAPS Take 1 capsule by mouth daily.      Marland Kitchen loratadine (CLARITIN) 10 MG tablet Take 10 mg by mouth daily.       No current facility-administered medications for this visit.    Allergies  Allergen Reactions  . Erythromycin     History   Social History  . Marital Status: Divorced    Spouse Name: N/A    Number of Children: 2  . Years of Education: N/A   Occupational History  . Not on file.   Social History Main Topics  . Smoking status: Former Smoker -- 1.50 packs/day for 50 years    Types: Cigarettes    Quit date: 04/07/2010  . Smokeless tobacco: Never Used  . Alcohol Use: No  . Drug Use: No  . Sexually Active: Not on file   Other Topics Concern  .  Not on file   Social History Narrative  . No narrative on file     Review of Systems: General: negative for chills, fever, night sweats or weight changes.  Cardiovascular: negative for chest pain, dyspnea on exertion, edema, orthopnea, palpitations, paroxysmal nocturnal dyspnea or shortness of breath Dermatological: negative for rash Respiratory: negative for cough or wheezing Urologic: negative for hematuria Abdominal: negative for nausea, vomiting, diarrhea, bright red blood per rectum, melena, or hematemesis Neurologic: negative for visual changes, syncope, or dizziness All other systems reviewed and are otherwise negative except as noted above.    Blood pressure 124/72, pulse 64, height 5\' 3"  (1.6 m), weight 153 lb (69.4 kg).  General appearance: alert and no distress Neck: no adenopathy, no carotid bruit, no JVD, supple, symmetrical, trachea midline and thyroid not enlarged, symmetric, no tenderness/mass/nodules Lungs: clear to auscultation bilaterally Heart: regular rate and rhythm, S1, S2 normal, no murmur, click, rub or gallop Extremities: extremities normal, atraumatic, no cyanosis or edema  EKG NSR at 54 with old ASMI and anterolateral TWI unchanged from the prior EKG  ASSESSMENT AND PLAN:   CAD (coronary artery disease) Stable CAD      Runell Gess MD [facility] [place] [facility] 02/13/2013 10:31 AM

## 2013-04-29 ENCOUNTER — Other Ambulatory Visit: Payer: Self-pay

## 2013-07-30 ENCOUNTER — Other Ambulatory Visit: Payer: Self-pay

## 2013-08-17 ENCOUNTER — Other Ambulatory Visit (HOSPITAL_COMMUNITY): Payer: Self-pay | Admitting: Internal Medicine

## 2013-08-17 DIAGNOSIS — Z139 Encounter for screening, unspecified: Secondary | ICD-10-CM

## 2013-09-10 ENCOUNTER — Ambulatory Visit (HOSPITAL_COMMUNITY)
Admission: RE | Admit: 2013-09-10 | Discharge: 2013-09-10 | Disposition: A | Discharge status: Home / Self Care | Payer: Medicare Other | Source: Ambulatory Visit | Attending: Internal Medicine | Admitting: Internal Medicine

## 2013-09-10 DIAGNOSIS — Z139 Encounter for screening, unspecified: Secondary | ICD-10-CM

## 2013-09-10 DIAGNOSIS — Z1231 Encounter for screening mammogram for malignant neoplasm of breast: Secondary | ICD-10-CM | POA: Insufficient documentation

## 2014-06-22 ENCOUNTER — Ambulatory Visit (INDEPENDENT_AMBULATORY_CARE_PROVIDER_SITE_OTHER): Payer: 59 | Admitting: Cardiovascular Disease

## 2014-06-22 ENCOUNTER — Encounter: Payer: Self-pay | Admitting: Cardiovascular Disease

## 2014-06-22 VITALS — BP 130/74 | HR 85 | Ht 63.0 in | Wt 152.6 lb

## 2014-06-22 DIAGNOSIS — E785 Hyperlipidemia, unspecified: Secondary | ICD-10-CM

## 2014-06-22 DIAGNOSIS — I2510 Atherosclerotic heart disease of native coronary artery without angina pectoris: Secondary | ICD-10-CM

## 2014-06-22 NOTE — Patient Instructions (Signed)
Dr Berry wants you to follow-up in 1 year . You will receive a reminder letter in the mail two months in advance. If you don't receive a letter, please call our office to schedule the follow-up appointment. 

## 2014-06-22 NOTE — Assessment & Plan Note (Signed)
History of emergency coronary bypass grafting by Dr. Roxy Manns 04/07/10 after catheter-related left main dissection. Her EF was 35-45% postop and Myoview stress test showed scar in the LAD territory. She is completely asymptomatic

## 2014-06-22 NOTE — Assessment & Plan Note (Signed)
She has gotten intolerant but recent blood work performed by her family care physician revealed a total cholesterol of 137, LDL 59 and HDL of 60.

## 2014-06-22 NOTE — Progress Notes (Signed)
06/22/2014 Amy Finley   [birth year]  [id number]  Primary Physician [name], MD Primary Cardiologist: Lorretta Harp MD [name]   HPI:  The patient is a very pleasant 73 year old, thin-appearing, married Caucasian female, mother of 2 children who I last saw 16 months ago. She has a history of ST-segment-elevation myocardial infarction treated with PCI and stenting of her apical LAD which is complicated by a left main dissection requiring emergency coronary artery bypass grafting x2 by Dr. Darylene Price April 07, 2010. She had a vein to her LAD and circumflex. 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 Myoview showed scar in the LAD territory. She has been completely asymptomatic. Her most recent lab work performed 01/20/14 revealed a glucose of 137, LDL 59 HDL of 60   Current Outpatient Prescriptions  Medication Sig Dispense Refill  . aspirin EC 81 MG tablet Take 81 mg by mouth daily.      . B Complex-Biotin-FA (VITAMIN B50 COMPLEX PO) Take 1 capsule by mouth daily.      . Cholecalciferol (VITAMIN D-3) 1000 UNITS CAPS Take 1 capsule by mouth daily.      Marland Kitchen loratadine (CLARITIN) 10 MG tablet Take 10 mg by mouth daily.       No current facility-administered medications for this visit.    Allergies  Allergen Reactions  . Erythromycin     History   Social History  . Marital Status: Divorced    Spouse Name: N/A    Number of Children: 2  . Years of Education: N/A   Occupational History  . Not on file.   Social History Main Topics  . Smoking status: Former Smoker -- 1.50 packs/day for 50 years    Types: Cigarettes    Quit date: 04/07/2010  . Smokeless tobacco: Never Used  . Alcohol Use: No  . Drug Use: No  . Sexual Activity: Not on file   Other Topics Concern  . Not on file   Social History Narrative  . No narrative on file     Review of  Systems: General: negative for chills, fever, night sweats or weight changes.  Cardiovascular: negative for chest pain, dyspnea on exertion, edema, orthopnea, palpitations, paroxysmal nocturnal dyspnea or shortness of breath Dermatological: negative for rash Respiratory: negative for cough or wheezing Urologic: negative for hematuria Abdominal: negative for nausea, vomiting, diarrhea, bright red blood per rectum, melena, or hematemesis Neurologic: negative for visual changes, syncope, or dizziness All other systems reviewed and are otherwise negative except as noted above.    Blood pressure 130/74, pulse 85, height 5\' 3"  (1.6 m), weight 152 lb 9.6 oz (69.219 kg).  General appearance: alert and no distress Neck: no adenopathy, no carotid bruit, no JVD, supple, symmetrical, trachea midline and thyroid not enlarged, symmetric, no tenderness/mass/nodules Lungs: clear to auscultation bilaterally Heart: regular rate and rhythm, S1, S2 normal, no murmur, click, rub or gallop Extremities: extremities normal, atraumatic, no cyanosis or edema  EKG normal sinus rhythm at 85 with septal Q waves and occasional PVCs  ASSESSMENT AND PLAN:   CAD (coronary artery disease) History of emergency coronary bypass grafting by Dr. Roxy Manns 04/07/10 after catheter-related left main dissection. Her EF was 35-45% postop and Myoview stress test showed scar in the LAD territory. She is completely asymptomatic  Hyperlipidemia She has gotten intolerant but recent blood work performed by her family care physician revealed a  total cholesterol of 137, LDL 59 and HDL of 60.      Lorretta Harp MD FACP,FACC,FAHA, [facility] 06/22/2014 3:31 PM

## 2014-09-15 ENCOUNTER — Other Ambulatory Visit (HOSPITAL_COMMUNITY): Payer: Self-pay | Admitting: Internal Medicine

## 2014-09-15 DIAGNOSIS — Z1231 Encounter for screening mammogram for malignant neoplasm of breast: Secondary | ICD-10-CM

## 2014-09-20 ENCOUNTER — Ambulatory Visit (HOSPITAL_COMMUNITY)
Admission: RE | Admit: 2014-09-20 | Discharge: 2014-09-20 | Disposition: A | Discharge status: Home / Self Care | Payer: Medicare Other | Source: Ambulatory Visit | Attending: Internal Medicine | Admitting: Internal Medicine

## 2014-09-20 DIAGNOSIS — Z1231 Encounter for screening mammogram for malignant neoplasm of breast: Secondary | ICD-10-CM | POA: Insufficient documentation

## 2015-03-02 ENCOUNTER — Telehealth: Payer: Self-pay | Admitting: *Deleted

## 2015-03-02 NOTE — Telephone Encounter (Signed)
Returned patient's call to schedule September appointment with Dr. Gwenlyn Found.

## 2015-03-21 NOTE — Patient Instructions (Signed)
Amy Finley  03/21/2015     @PREFPERIOPPHARMACY @   Your procedure is scheduled on 03/29/2015  Report to [facility] at 8:00 A.M.  Call this number if you have problems the morning of surgery:  [phone]   Remember:  Do not eat food or drink liquids after midnight.  Take these medicines the morning of surgery with A SIP OF WATER Claritin   Do not wear jewelry, make-up or nail polish.  Do not wear lotions, powders, or perfumes.  You may wear deodorant.  Do not shave 48 hours prior to surgery.  Men may shave face and neck.  Do not bring valuables to the hospital.  [facility] is not responsible for any belongings or valuables.  Contacts, dentures or bridgework may not be worn into surgery.  Leave your suitcase in the car.  After surgery it may be brought to your room.  For patients admitted to the hospital, discharge time will be determined by your treatment team.  Patients discharged the day of surgery will not be allowed to drive home.   Please read over the following fact sheets that you were given. Anesthesia Post-op Instructions      PATIENT INSTRUCTIONS POST-ANESTHESIA  IMMEDIATELY FOLLOWING SURGERY:  Do not drive or operate machinery for the first twenty four hours after surgery.  Do not make any important decisions for twenty four hours after surgery or while taking narcotic pain medications or sedatives.  If you develop intractable nausea and vomiting or a severe headache please notify your doctor immediately.  FOLLOW-UP:  Please make an appointment with your surgeon as instructed. You do not need to follow up with anesthesia unless specifically instructed to do so.  WOUND CARE INSTRUCTIONS (if applicable):  Keep a dry clean dressing on the anesthesia/puncture wound site if there is drainage.  Once the wound has quit draining you may leave it open to air.  Generally you should leave the bandage intact for twenty four hours unless there is drainage.  If the epidural  site drains for more than 36-48 hours please call the anesthesia department.  QUESTIONS?:  Please feel free to call your physician or the hospital operator if you have any questions, and they will be happy to assist you.      Cataract A cataract is a clouding of the lens of the eye. When a lens becomes cloudy, vision is reduced based on the degree and nature of the clouding. Many cataracts reduce vision to some degree. Some cataracts make people more near-sighted as they develop. Other cataracts increase glare. Cataracts that are ignored and become worse can sometimes look white. The white color can be seen through the pupil. CAUSES   Aging. However, cataracts may occur at any age, even in newborns.  Certain drugs.  Trauma to the eye.  Certain diseases such as diabetes.  Specific eye diseases such as chronic inflammation inside the eye or a sudden attack of a rare form of glaucoma.  Inherited or acquired medical problems. SYMPTOMS   Gradual, progressive drop in vision in the affected eye.  Severe, rapid visual loss. This most often happens when trauma is the cause. DIAGNOSIS  To detect a cataract, an eye doctor examines the lens. Cataracts are best diagnosed with an exam of the eyes with the pupils enlarged (dilated) by drops.  TREATMENT  For an early cataract, vision may improve by using different eyeglasses or stronger lighting. If that does not help your vision, surgery is the only  effective treatment. A cataract needs to be surgically removed when vision loss interferes with your everyday activities, such as driving, reading, or watching TV. A cataract may also have to be removed if it prevents examination or treatment of another eye problem. Surgery removes the cloudy lens and usually replaces it with a substitute lens (intraocular lens, IOL).  At a time when both you and your doctor agree, the cataract will be surgically removed. If you have cataracts in both eyes, only one is  usually removed at a time. This allows the operated eye to heal and be out of danger from any possible problems after surgery (such as infection or poor wound healing). In rare cases, a cataract may be doing damage to your eye. In these cases, your caregiver may advise surgical removal right away. The vast majority of people who have cataract surgery have better vision afterward. HOME CARE INSTRUCTIONS  If you are not planning surgery, you may be asked to do the following:  Use different eyeglasses.  Use stronger or brighter lighting.  Ask your eye doctor about reducing your medicine dose or changing medicines if it is thought that a medicine caused your cataract. Changing medicines does not make the cataract go away on its own.  Become familiar with your surroundings. Poor vision can lead to injury. Avoid bumping into things on the affected side. You are at a higher risk for tripping or falling.  Exercise extreme care when driving or operating machinery.  Wear sunglasses if you are sensitive to bright light or experiencing problems with glare. SEEK IMMEDIATE MEDICAL CARE IF:   You have a worsening or sudden vision loss.  You notice redness, swelling, or increasing pain in the eye.  You have a fever. Document Released: 09/10/2005 Document Revised: 12/03/2011 Document Reviewed: 05/04/2011 [facility] Patient Information 2015 [place], [place]. This information is not intended to replace advice given to you by your health care provider. Make sure you discuss any questions you have with your health care provider.

## 2015-03-22 ENCOUNTER — Encounter (HOSPITAL_COMMUNITY)
Admission: RE | Admit: 2015-03-22 | Discharge: 2015-03-22 | Disposition: A | Discharge status: Home / Self Care | Payer: Medicare Other | Source: Ambulatory Visit | Attending: Ophthalmology | Admitting: Ophthalmology

## 2015-03-22 ENCOUNTER — Encounter (HOSPITAL_COMMUNITY): Payer: Self-pay

## 2015-03-22 ENCOUNTER — Other Ambulatory Visit: Payer: Self-pay

## 2015-03-22 DIAGNOSIS — Z01818 Encounter for other preprocedural examination: Secondary | ICD-10-CM | POA: Insufficient documentation

## 2015-03-22 DIAGNOSIS — H2512 Age-related nuclear cataract, left eye: Secondary | ICD-10-CM | POA: Diagnosis not present

## 2015-03-22 LAB — BASIC METABOLIC PANEL
ANION GAP: 7 (ref 5–15)
BUN: 30 mg/dL — ABNORMAL HIGH (ref 6–20)
CHLORIDE: 105 mmol/L (ref 101–111)
CO2: 27 mmol/L (ref 22–32)
Calcium: 8.7 mg/dL — ABNORMAL LOW (ref 8.9–10.3)
Creatinine, Ser: 0.85 mg/dL (ref 0.44–1.00)
GFR calc Af Amer: >60 mL/min (ref >60)
GFR calc non Af Amer: >60 mL/min (ref >60)
Glucose, Bld: 106 mg/dL — ABNORMAL HIGH (ref 65–99)
Potassium: 4.4 mmol/L (ref 3.5–5.1)
SODIUM: 139 mmol/L (ref 135–145)

## 2015-03-22 LAB — CBC
HEMATOCRIT: 40.9 % (ref 36.0–46.0)
HEMOGLOBIN: 13.4 g/dL (ref 12.0–15.0)
MCH: 29.3 pg (ref 26.0–34.0)
MCHC: 32.8 g/dL (ref 30.0–36.0)
MCV: 89.3 fL (ref 78.0–100.0)
Platelets: 246 10*3/uL (ref 150–400)
RBC: 4.58 MIL/uL (ref 3.87–5.11)
RDW: 13.1 % (ref 11.5–15.5)
WBC: 7.5 10*3/uL (ref 4.0–10.5)

## 2015-03-29 ENCOUNTER — Ambulatory Visit (HOSPITAL_COMMUNITY): Payer: Medicare Other | Admitting: Anesthesiology

## 2015-03-29 ENCOUNTER — Encounter (HOSPITAL_COMMUNITY)
Admission: RE | Disposition: A | Discharge status: Home / Self Care | Payer: Self-pay | Source: Ambulatory Visit | Attending: Ophthalmology

## 2015-03-29 ENCOUNTER — Encounter (HOSPITAL_COMMUNITY): Payer: Self-pay | Admitting: *Deleted

## 2015-03-29 ENCOUNTER — Ambulatory Visit (HOSPITAL_COMMUNITY)
Admission: RE | Admit: 2015-03-29 | Discharge: 2015-03-29 | Disposition: A | Discharge status: Home / Self Care | Payer: Medicare Other | Source: Ambulatory Visit | Attending: Ophthalmology | Admitting: Ophthalmology

## 2015-03-29 DIAGNOSIS — Z8543 Personal history of malignant neoplasm of ovary: Secondary | ICD-10-CM | POA: Insufficient documentation

## 2015-03-29 DIAGNOSIS — H2512 Age-related nuclear cataract, left eye: Secondary | ICD-10-CM | POA: Diagnosis not present

## 2015-03-29 DIAGNOSIS — I252 Old myocardial infarction: Secondary | ICD-10-CM | POA: Diagnosis not present

## 2015-03-29 DIAGNOSIS — Z87891 Personal history of nicotine dependence: Secondary | ICD-10-CM | POA: Insufficient documentation

## 2015-03-29 DIAGNOSIS — Z951 Presence of aortocoronary bypass graft: Secondary | ICD-10-CM | POA: Insufficient documentation

## 2015-03-29 DIAGNOSIS — I2510 Atherosclerotic heart disease of native coronary artery without angina pectoris: Secondary | ICD-10-CM | POA: Insufficient documentation

## 2015-03-29 HISTORY — PX: CATARACT EXTRACTION W/PHACO: SHX586

## 2015-03-29 SURGERY — PHACOEMULSIFICATION, CATARACT, WITH IOL INSERTION
Anesthesia: Monitor Anesthesia Care | Site: Eye | Laterality: Left

## 2015-03-29 MED ORDER — PHENYLEPHRINE-KETOROLAC 1-0.3 % IO SOLN
INTRAOCULAR | Status: DC | PRN
  Administered 2015-03-29: 500 mL via OPHTHALMIC

## 2015-03-29 MED ORDER — TETRACAINE HCL 0.5 % OP SOLN
1.0000 [drp] | OPHTHALMIC | Status: AC
  Administered 2015-03-29 (×3): 1 [drp] via OPHTHALMIC

## 2015-03-29 MED ORDER — NA HYALUR & NA CHOND-NA HYALUR 0.55-0.5 ML IO KIT
PACK | INTRAOCULAR | Status: DC | PRN
  Administered 2015-03-29: 1 via OPHTHALMIC

## 2015-03-29 MED ORDER — LACTATED RINGERS IV SOLN
INTRAVENOUS | Status: DC
  Administered 2015-03-29: 1000 mL via INTRAVENOUS

## 2015-03-29 MED ORDER — LIDOCAINE HCL 3.5 % OP GEL
OPHTHALMIC | Status: DC | PRN
Start: 2015-03-29 — End: 2015-03-29
  Administered 2015-03-29: 1 via OPHTHALMIC

## 2015-03-29 MED ORDER — POVIDONE-IODINE 5 % OP SOLN
OPHTHALMIC | Status: DC | PRN
  Administered 2015-03-29: 1 via OPHTHALMIC

## 2015-03-29 MED ORDER — MIDAZOLAM HCL 2 MG/2ML IJ SOLN
1.0000 mg | INTRAMUSCULAR | Status: DC | PRN
  Administered 2015-03-29: 2 mg via INTRAVENOUS

## 2015-03-29 MED ORDER — FENTANYL CITRATE (PF) 100 MCG/2ML IJ SOLN
25.0000 ug | INTRAMUSCULAR | Status: AC
  Administered 2015-03-29 (×2): 25 ug via INTRAVENOUS

## 2015-03-29 MED ORDER — ONDANSETRON HCL 4 MG/2ML IJ SOLN: 4.0000 mg | Freq: Once | INTRAMUSCULAR | Status: DC | PRN

## 2015-03-29 MED ORDER — PHENYLEPHRINE-KETOROLAC 1-0.3 % IO SOLN
INTRAOCULAR | Status: AC
  Filled 2015-03-29: qty 4

## 2015-03-29 MED ORDER — CYCLOPENTOLATE-PHENYLEPHRINE 0.2-1 % OP SOLN
1.0000 [drp] | OPHTHALMIC | Status: AC
  Administered 2015-03-29 (×3): 1 [drp] via OPHTHALMIC

## 2015-03-29 MED ORDER — FENTANYL CITRATE (PF) 100 MCG/2ML IJ SOLN: 25.0000 ug | INTRAMUSCULAR | Status: DC | PRN

## 2015-03-29 MED ORDER — MIDAZOLAM HCL 2 MG/2ML IJ SOLN
INTRAMUSCULAR | Status: AC
  Filled 2015-03-29: qty 2

## 2015-03-29 MED ORDER — TETRACAINE 0.5 % OP SOLN OPTIME - NO CHARGE
OPHTHALMIC | Status: DC | PRN
  Administered 2015-03-29: 1 [drp] via OPHTHALMIC

## 2015-03-29 MED ORDER — FENTANYL CITRATE (PF) 100 MCG/2ML IJ SOLN
INTRAMUSCULAR | Status: AC
  Filled 2015-03-29: qty 2

## 2015-03-29 MED ORDER — LIDOCAINE HCL 3.5 % OP GEL: 1.0000 "application " | Freq: Once | OPHTHALMIC | Status: DC

## 2015-03-29 MED ORDER — BSS IO SOLN
INTRAOCULAR | Status: DC | PRN
  Administered 2015-03-29: 15 mL via INTRAOCULAR

## 2015-03-29 SURGICAL SUPPLY — 28 items
CAPSULAR TENSION RING-AMO (OPHTHALMIC RELATED) IMPLANT
CLOTH BEACON ORANGE TIMEOUT ST (SAFETY) ×2 IMPLANT
GLOVE BIO SURGEON STRL SZ7.5 (GLOVE) IMPLANT
GLOVE BIOGEL M 6.5 STRL (GLOVE) IMPLANT
GLOVE BIOGEL PI IND STRL 6.5 (GLOVE) IMPLANT
GLOVE BIOGEL PI IND STRL 7.0 (GLOVE) ×2 IMPLANT
GLOVE BIOGEL PI INDICATOR 6.5 (GLOVE)
GLOVE BIOGEL PI INDICATOR 7.0 (GLOVE) ×2
GLOVE ECLIPSE 6.5 STRL STRAW (GLOVE) IMPLANT
GLOVE ECLIPSE 7.5 STRL STRAW (GLOVE) IMPLANT
GLOVE EXAM NITRILE LRG STRL (GLOVE) IMPLANT
GLOVE EXAM NITRILE MD LF STRL (GLOVE) IMPLANT
GLOVE SKINSENSE NS SZ6.5 (GLOVE)
GLOVE SKINSENSE NS SZ7.0 (GLOVE)
GLOVE SKINSENSE STRL SZ6.5 (GLOVE) IMPLANT
GLOVE SKINSENSE STRL SZ7.0 (GLOVE) IMPLANT
INST SET CATARACT ~~LOC~~ (KITS) ×2 IMPLANT
KIT VITRECTOMY (OPHTHALMIC RELATED) IMPLANT
LENS ALC ACRYL/TECN (Ophthalmic Related) ×2 IMPLANT
PAD ARMBOARD 7.5X6 YLW CONV (MISCELLANEOUS) ×2 IMPLANT
PROC W NO LENS (INTRAOCULAR LENS)
PROC W SPEC LENS (INTRAOCULAR LENS)
PROCESS W NO LENS (INTRAOCULAR LENS) IMPLANT
PROCESS W SPEC LENS (INTRAOCULAR LENS) IMPLANT
RETRACTOR IRIS SIGHTPATH (OPHTHALMIC RELATED) IMPLANT
RING MALYGIN (MISCELLANEOUS) IMPLANT
VISCOELASTIC ADDITIONAL (OPHTHALMIC RELATED) IMPLANT
WATER STERILE IRR 250ML POUR (IV SOLUTION) ×2 IMPLANT

## 2015-03-29 NOTE — Discharge Instructions (Signed)
Arisbel Maione Fons 03/29/2015 Dr. Iona Hansen Post operative Instructions for Cataract Patients  These instructions are for [place] and pertain to the operative eye.  1.  Resume your normal diet and previous oral medicines.  2. Your Follow-up appointment is at Dr. Iona Hansen' office in [place] on 03/30/15 at 47.  3. You may leave the hospital when your driver is present and your nurse releases you.  4. Begin Pred Forte (prednisolone acetate 1%), Acular LS (ketorolac tromethamine .4%) and Gatifloxacin 0.5% eye drops; 1 drop each 4 times daily to operative eye. Begin 3 hours after discharge from Short Stay Unit.  Moxifloxacin 0.5% may be substituted for Gatifloxacin using the same instructions.  25. Page Dr. Iona Hansen via beeper [phone] for significant pain in or around operative eye that is not relieved by Tylenol.  6. If you took Plavix before surgery, restart it at the usual dose on the evening of surgery.  7. Wear dark glasses as necessary for excessive light sensitivity.  8. Do no forcefully rub you your operative eye.  9. Keep your operative eye dry for 1 week. You may gently clean your eyelids with a damp washcloth.  10. You may resume normal occupational activities in one week and resume driving as tolerated after the first post operative visit.  11. It is normal to have blurred vision and a scratchy sensation following surgery.  Dr. Iona Hansen: [phone] Monitored Anesthesia Care Monitored anesthesia care is an anesthesia service for a medical procedure. Anesthesia is the loss of the ability to feel pain. It is produced by medicines called anesthetics. It may affect a small area of your body (local anesthesia), a large area of your body (regional anesthesia), or your entire body (general anesthesia). The need for monitored anesthesia care depends your procedure, your condition, and the potential need for regional or general anesthesia. It is often provided during procedures where:   General  anesthesia may be needed if there are complications. This is because you need special care when you are under general anesthesia.   You will be under local or regional anesthesia. This is so that you are able to have higher levels of anesthesia if needed.   You will receive calming medicines (sedatives). This is especially the case if sedatives are given to put you in a semi-conscious state of relaxation (deep sedation). This is because the amount of sedative needed to produce this state can be hard to predict. Too much of a sedative can produce general anesthesia. Monitored anesthesia care is performed by one or more health care providers who have special training in all types of anesthesia. You will need to meet with these health care providers before your procedure. During this meeting, they will ask you about your medical history. They will also give you instructions to follow. (For example, you will need to stop eating and drinking before your procedure. You may also need to stop or change medicines you are taking.) During your procedure, your health care providers will stay with you. They will:   Watch your condition. This includes watching your blood pressure, breathing, and level of pain.   Diagnose and treat problems that occur.   Give medicines if they are needed. These may include calming medicines (sedatives) and anesthetics.   Make sure you are comfortable.  Having monitored anesthesia care does not necessarily mean that you will be under anesthesia. It does mean that your health care providers will be able to manage anesthesia if you need it or  if it occurs. It also means that you will be able to have a different type of anesthesia than you are having if you need it. When your procedure is complete, your health care providers will continue to watch your condition. They will make sure any medicines wear off before you are allowed to go home.  Document Released: 06/06/2005 Document  Revised: 01/25/2014 Document Reviewed: 10/22/2012 [facility] Patient Information 2015 [place], [place]. This information is not intended to replace advice given to you by your health care provider. Make sure you discuss any questions you have with your health care provider.

## 2015-03-29 NOTE — Anesthesia Preprocedure Evaluation (Signed)
Anesthesia Evaluation  Patient identified by MRN, date of birth, ID band Patient awake    Reviewed: Allergy & Precautions, NPO status , Patient's Chart, lab work & pertinent test results  Airway Mallampati: II  TM Distance: >3 FB     Dental   Pulmonary former smoker,  breath sounds clear to auscultation        Cardiovascular + CAD, + Past MI and + CABG Rhythm:Regular Rate:Normal     Neuro/Psych    GI/Hepatic negative GI ROS,   Endo/Other    Renal/GU      Musculoskeletal   Abdominal   Peds  Hematology   Anesthesia Other Findings   Reproductive/Obstetrics                             Anesthesia Physical Anesthesia Plan  ASA: III  Anesthesia Plan: MAC   Post-op Pain Management:    Induction: Intravenous  Airway Management Planned: Nasal Cannula  Additional Equipment:   Intra-op Plan:   Post-operative Plan:   Informed Consent: I have reviewed the patients History and Physical, chart, labs and discussed the procedure including the risks, benefits and alternatives for the proposed anesthesia with the patient or authorized representative who has indicated his/her understanding and acceptance.     Plan Discussed with:   Anesthesia Plan Comments:         Anesthesia Quick Evaluation

## 2015-03-29 NOTE — Transfer of Care (Signed)
Immediate Anesthesia Transfer of Care Note  Patient: Amy Finley  Procedure(s) Performed: Procedure(s) with comments: CATARACT EXTRACTION PHACO AND INTRAOCULAR LENS PLACEMENT; (Left) - CDE 7.86  Patient Location: Short Stay  Anesthesia Type:MAC  Level of Consciousness: awake, alert  and oriented  Airway & Oxygen Therapy: Patient Spontanous Breathing  Post-op Assessment: Report given to RN  Post vital signs: Reviewed  Last Vitals:  Filed Vitals:   03/29/15 0925  BP: 104/52  Pulse:   Temp:   Resp: 12    Complications: No apparent anesthesia complications

## 2015-03-29 NOTE — Brief Op Note (Signed)
03/29/2015  10:10 AM  PATIENT:  Amy Finley  74 y.o. female  PRE-OPERATIVE DIAGNOSIS:  nuclear cataract left eye  POST-OPERATIVE DIAGNOSIS:  nuclear cataract left eye  PROCEDURE:  Procedure(s): CATARACT EXTRACTION PHACO AND INTRAOCULAR LENS PLACEMENT;  SURGEON:  Surgeon(s): Williams Che, MD  ASSISTANTS: Bonney Roussel, CST   ANESTHESIA STAFF: Anesthesiologist: Lerry Liner, MD CRNA: Ollen Bowl, CRNA  ANESTHESIA:   topical and MAC  REQUESTED LENS POWER: 18.5  LENS IMPLANT INFORMATION:   Alcon SN60WF s/n [id number]  Exp 12/2018  CUMULATIVE DISSIPATED ENERGY:7.86  INDICATIONS:see scanned office H&P  OP FINDINGS:dense NS  COMPLICATIONS:None  DICTATION #: none  PLAN OF CARE: as above  PATIENT DISPOSITION:  Short Stay

## 2015-03-29 NOTE — Op Note (Signed)
03/29/2015  10:10 AM  PATIENT:  Amy Finley  74 y.o. female  PRE-OPERATIVE DIAGNOSIS:  nuclear cataract left eye  POST-OPERATIVE DIAGNOSIS:  nuclear cataract left eye  PROCEDURE:  Procedure(s): CATARACT EXTRACTION PHACO AND INTRAOCULAR LENS PLACEMENT;  SURGEON:  Surgeon(s): Williams Che, MD  ASSISTANTS: Bonney Roussel, CST   ANESTHESIA STAFF: Anesthesiologist: Lerry Liner, MD CRNA: Ollen Bowl, CRNA  ANESTHESIA:   topical and MAC  REQUESTED LENS POWER: 18.5  LENS IMPLANT INFORMATION:   Alcon SN60WF s/n [id number]  Exp 12/2018  CUMULATIVE DISSIPATED ENERGY:7.86  INDICATIONS:see scanned office H&P  OP FINDINGS:dense NS  COMPLICATIONS:None  Procedure:  The patient was brought to the operating room in good condition.  The operative eye was prepped and draped in the usual fashion for intraocular surgery.  Lidocaine gel was dropped onto the eye.  A 2.4 mm 10 O'clock near clear corneal stepped incision and a 12 O'clock stab incision were created.  Viscoat was instilled into the anterior chamber.  The 5 mm anterior capsulorhexis was performed with a bent needle cystotome and Utrata forceps.  The lens was hydrodissected and hydrodelineated with a cannula and balanced salt solution and rotated with a Kuglen hook.  Phacoemulsification was perfomed in the divide and conquer technique.  The remaining cortex was removed with I&A and the capsular surfaces polished as necessary.  Provisc was placed into the capsular bag and the lens inserted with the Alcon inserter.  The viscoelastic was removed with I&A and the lens "rocked" into position.  The wounds were hydrated and te anterior chamber was refilled with balanced salt solution.  The wounds were checked for leakage and rehydrated as necessary.  The lid speculum and drapes were removed and the patient was transported to short stay in good condition.  PATIENT DISPOSITION:  Short Stay

## 2015-03-29 NOTE — H&P (Signed)
I have reviewed the pre printed H&P, the patient was re-examined, and I have identified no significant interval changes in the patient's medical condition.  There is no change in the plan of care since the history and physical of record. 

## 2015-03-29 NOTE — Anesthesia Postprocedure Evaluation (Signed)
  Anesthesia Post-op Note  Patient: Amy Finley  Procedure(s) Performed: Procedure(s) with comments: CATARACT EXTRACTION PHACO AND INTRAOCULAR LENS PLACEMENT; (Left) - CDE 7.86  Patient Location: Short Stay  Anesthesia Type:MAC  Level of Consciousness: awake, alert  and oriented  Airway and Oxygen Therapy: Patient Spontanous Breathing  Post-op Pain: none  Post-op Assessment: Post-op Vital signs reviewed, Patient's Cardiovascular Status Stable, Respiratory Function Stable, Patent Airway and No signs of Nausea or vomiting              Post-op Vital Signs: Reviewed and stable  Last Vitals:  Filed Vitals:   03/29/15 0925  BP: 104/52  Pulse:   Temp:   Resp: 12    Complications: No apparent anesthesia complications

## 2015-03-30 ENCOUNTER — Encounter (HOSPITAL_COMMUNITY): Payer: Self-pay | Admitting: Ophthalmology

## 2015-06-24 ENCOUNTER — Encounter: Payer: Self-pay | Admitting: Cardiovascular Disease

## 2015-06-24 ENCOUNTER — Ambulatory Visit (INDEPENDENT_AMBULATORY_CARE_PROVIDER_SITE_OTHER): Payer: Medicare Other | Admitting: Cardiovascular Disease

## 2015-06-24 VITALS — BP 120/70 | HR 84 | Ht 63.0 in | Wt 152.0 lb

## 2015-06-24 DIAGNOSIS — I2510 Atherosclerotic heart disease of native coronary artery without angina pectoris: Secondary | ICD-10-CM

## 2015-06-24 DIAGNOSIS — I2583 Coronary atherosclerosis due to lipid rich plaque: Secondary | ICD-10-CM

## 2015-06-24 DIAGNOSIS — E785 Hyperlipidemia, unspecified: Secondary | ICD-10-CM | POA: Diagnosis not present

## 2015-06-24 NOTE — Progress Notes (Signed)
06/24/2015 Amy Finley   05/20/1941  [id number]  Primary Physician [name], MD Primary Cardiologist: Lorretta Harp MD [name]   HPI:  The patient is a very pleasant 74 year old, thin-appearing, married Caucasian female, mother of 2 children who I last saw 12 months ago. She has a history of ST-segment-elevation myocardial infarction treated with PCI and stenting of her apical LAD which is complicated by a left main dissection requiring emergency coronary artery bypass grafting x2 by Dr. Darylene Price April 07, 2010. She had a vein to her LAD and circumflex. 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 Myoview showed scar in the LAD territory. She has been completely asymptomatic. Her most recent lab work performed 01/31/15 revealed a total cholesterol 146, LDL 67 and HDL of 65.  Current Outpatient Prescriptions  Medication Sig Dispense Refill  . Ascorbic Acid (VITAMIN C PO) Take 1 tablet by mouth daily.    . B Complex-Biotin-FA (VITAMIN B50 COMPLEX PO) Take 1 capsule by mouth daily.    . Cholecalciferol (VITAMIN D3) 2000 UNITS TABS Take 2,000 Units by mouth daily.    Marland Kitchen loratadine (CLARITIN) 10 MG tablet Take 10 mg by mouth daily.    . Multiple Vitamins-Minerals (EYE VITAMINS & MINERALS) TABS Take 1 tablet by mouth 3 (three) times daily.    Marland Kitchen UBIQUINOL PO Take 1 tablet by mouth daily.     No current facility-administered medications for this visit.    Allergies  Allergen Reactions  . Erythromycin Other (See Comments)    Severe abdominal pain.    Social History   Social History  . Marital Status: Single    Spouse Name: N/A  . Number of Children: 2  . Years of Education: N/A   Occupational History  . Not on file.   Social History Main Topics  . Smoking status: Former Smoker -- 1.50 packs/day for 50 years    Types: Cigarettes    Quit date: 04/07/2010  .  Smokeless tobacco: Never Used  . Alcohol Use: No  . Drug Use: No  . Sexual Activity: Not on file   Other Topics Concern  . Not on file   Social History Narrative     Review of Systems: General: negative for chills, fever, night sweats or weight changes.  Cardiovascular: negative for chest pain, dyspnea on exertion, edema, orthopnea, palpitations, paroxysmal nocturnal dyspnea or shortness of breath Dermatological: negative for rash Respiratory: negative for cough or wheezing Urologic: negative for hematuria Abdominal: negative for nausea, vomiting, diarrhea, bright red blood per rectum, melena, or hematemesis Neurologic: negative for visual changes, syncope, or dizziness All other systems reviewed and are otherwise negative except as noted above.    Blood pressure 120/70, pulse 84, height 5\' 3"  (1.6 m), weight 152 lb (68.947 kg).  General appearance: alert and no distress Neck: no adenopathy, no carotid bruit, no JVD, supple, symmetrical, trachea midline and thyroid not enlarged, symmetric, no tenderness/mass/nodules Lungs: clear to auscultation bilaterally Heart: regular rate and rhythm, S1, S2 normal, no murmur, click, rub or gallop Extremities: extremities normal, atraumatic, no cyanosis or edema  EKG normal sinus rhythm at 84 with bifascicular block (right bundle branch block, left posterior fascicular block), new since previous tracing. I personally reviewed this EKG  ASSESSMENT AND PLAN:   CAD (coronary artery disease) History of coronary artery disease status post left main dissection the setting of a diagnostic  recatheterization 04/07/10 with emergent coronary artery bypass grafting by Dr. Roxy Manns . She had a vein to LAD and circumflex. Her EF intraoperatively was 30-35% and by echo 06/30/2010 was up to 35-45%. Myoview showed scar in the LAD territory. She is completely asymptomatic.      Lorretta Harp MD FACP,FACC,FAHA, [facility] 06/24/2015 3:07 PM

## 2015-06-24 NOTE — Patient Instructions (Signed)
Medication Instructions:  Your physician recommends that you continue on your current medications as directed. Please refer to the Current Medication list given to you today.   Labwork: NONE  Testing/Procedures: NONE  Follow-Up: Your physician wants you to follow-up in: 12 months with Dr. Gwenlyn Found. You will receive a reminder letter in the mail two months in advance. If you don't receive a letter, please call our office to schedule the follow-up appointment.   Any Other Special Instructions Will Be Listed Below (If Applicable).

## 2015-06-24 NOTE — Assessment & Plan Note (Addendum)
History of coronary artery disease status post left main dissection the setting of a diagnostic recatheterization 04/07/10 with emergent coronary artery bypass grafting by Dr. Roxy Manns . She had a vein to LAD and circumflex. Her EF intraoperatively was 30-35% and by echo 06/30/2010 was up to 35-45%. Myoview showed scar in the LAD territory. She is completely asymptomatic.

## 2015-07-08 ENCOUNTER — Encounter: Payer: Self-pay | Admitting: Cardiovascular Disease

## 2015-07-26 NOTE — Patient Instructions (Addendum)
Amy Finley  07/26/2015     @PREFPERIOPPHARMACY @   Your procedure is scheduled on 08/01/2015.  Report to [facility] at 6:30 A.M.  Call this number if you have problems the morning of surgery:  [phone]   Remember:  Do not eat food or drink liquids after midnight.  Take these medicines the morning of surgery with A SIP OF WATER Claritin   Do not wear jewelry, make-up or nail polish.  Do not wear lotions, powders, or perfumes.  You may wear deodorant.  Do not shave 48 hours prior to surgery.  Men may shave face and neck.  Do not bring valuables to the hospital.  [facility] is not responsible for any belongings or valuables.  Contacts, dentures or bridgework may not be worn into surgery.  Leave your suitcase in the car.  After surgery it may be brought to your room.  For patients admitted to the hospital, discharge time will be determined by your treatment team.  Patients discharged the day of surgery will not be allowed to drive home.   Please read over the following fact sheets that you were given. Anesthesia Post-op Instructions     PATIENT INSTRUCTIONS POST-ANESTHESIA  IMMEDIATELY FOLLOWING SURGERY:  Do not drive or operate machinery for the first twenty four hours after surgery.  Do not make any important decisions for twenty four hours after surgery or while taking narcotic pain medications or sedatives.  If you develop intractable nausea and vomiting or a severe headache please notify your doctor immediately.  FOLLOW-UP:  Please make an appointment with your surgeon as instructed. You do not need to follow up with anesthesia unless specifically instructed to do so.  WOUND CARE INSTRUCTIONS (if applicable):  Keep a dry clean dressing on the anesthesia/puncture wound site if there is drainage.  Once the wound has quit draining you may leave it open to air.  Generally you should leave the bandage intact for twenty four hours unless there is drainage.  If the epidural  site drains for more than 36-48 hours please call the anesthesia department.  QUESTIONS?:  Please feel free to call your physician or the hospital operator if you have any questions, and they will be happy to assist you.       A cataract is a clouding of the lens of the eye. When a lens becomes cloudy, vision is reduced based on the degree and nature of the clouding. Surgery may be needed to improve vision. Surgery removes the cloudy lens and usually replaces it with a substitute lens (intraocular lens, IOL). LET YOUR EYE DOCTOR KNOW ABOUT:  Allergies to food or medicine.  Medicines taken including herbs, eye drops, over-the-counter medicines, and creams.  Use of steroids (by mouth or creams).  Previous problems with anesthetics or numbing medicine.  History of bleeding problems or blood clots.  Previous surgery.  Other health problems, including diabetes and kidney problems.  Possibility of pregnancy, if this applies. RISKS AND COMPLICATIONS  Infection.  Inflammation of the eyeball (endophthalmitis) that can spread to both eyes (sympathetic ophthalmia).  Poor wound healing.  If an IOL is inserted, it can later fall out of proper position. This is very uncommon.  Clouding of the part of your eye that holds an IOL in place. This is called an "after-cataract." These are uncommon but easily treated. BEFORE THE PROCEDURE  Do not eat or drink anything except small amounts of water for 8 to 12 before your surgery, or as  directed by your caregiver.  Unless you are told otherwise, continue any eye drops you have been prescribed.  Talk to your primary caregiver about all other medicines that you take (both prescription and nonprescription). In some cases, you may need to stop or change medicines near the time of your surgery. This is most important if you are taking blood-thinning medicine.Do not stop medicines unless you are told to do so.  Arrange for someone to drive you to and  from the procedure.  Do not put contact lenses in either eye on the day of your surgery. PROCEDURE There is more than one method for safely removing a cataract. Your doctor can explain the differences and help determine which is best for you. Phacoemulsification surgery is the most common form of cataract surgery.  An injection is given behind the eye or eye drops are given to make this a painless procedure.  A small cut (incision) is made on the edge of the clear, dome-shaped surface that covers the front of the eye (cornea).  A tiny probe is painlessly inserted into the eye. This device gives off ultrasound waves that soften and break up the cloudy center of the lens. This makes it easier for the cloudy lens to be removed by suction.  An IOL may be implanted.  The normal lens of the eye is covered by a clear capsule. Part of that capsule is intentionally left in the eye to support the IOL.  Your surgeon may or may not use stitches to close the incision. There are other forms of cataract surgery that require a larger incision and stitches to close the eye. This approach is taken in cases where the doctor feels that the cataract cannot be easily removed using phacoemulsification. AFTER THE PROCEDURE  When an IOL is implanted, it does not need care. It becomes a permanent part of your eye and cannot be seen or felt.  Your doctor will schedule follow-up exams to check on your progress.  Review your other medicines with your doctor to see which can be resumed after surgery.  Use eye drops or take medicine as prescribed by your doctor.   This information is not intended to replace advice given to you by your health care provider. Make sure you discuss any questions you have with your health care provider.   Document Released: 08/30/2011 Document Revised: 10/01/2014 Document Reviewed: 08/30/2011 Elsevier Interactive Patient Education [employer].

## 2015-07-27 ENCOUNTER — Encounter (HOSPITAL_COMMUNITY): Payer: Self-pay

## 2015-07-27 ENCOUNTER — Encounter (HOSPITAL_COMMUNITY)
Admission: RE | Admit: 2015-07-27 | Discharge: 2015-07-27 | Disposition: A | Discharge status: Home / Self Care | Payer: Medicare Other | Source: Ambulatory Visit | Attending: Ophthalmology | Admitting: Ophthalmology

## 2015-07-27 DIAGNOSIS — Z01818 Encounter for other preprocedural examination: Secondary | ICD-10-CM | POA: Insufficient documentation

## 2015-07-27 DIAGNOSIS — H2511 Age-related nuclear cataract, right eye: Secondary | ICD-10-CM | POA: Diagnosis not present

## 2015-07-27 LAB — BASIC METABOLIC PANEL
Anion gap: 8 (ref 5–15)
BUN: 24 mg/dL — AB (ref 6–20)
CO2: 29 mmol/L (ref 22–32)
Calcium: 9.4 mg/dL (ref 8.9–10.3)
Chloride: 102 mmol/L (ref 101–111)
Creatinine, Ser: 1 mg/dL (ref 0.44–1.00)
GFR calc Af Amer: >60 mL/min (ref >60)
GFR, EST NON AFRICAN AMERICAN: 55 mL/min — AB (ref >60)
GLUCOSE: 89 mg/dL (ref 65–99)
POTASSIUM: 4.2 mmol/L (ref 3.5–5.1)
Sodium: 139 mmol/L (ref 135–145)

## 2015-07-27 LAB — CBC
HCT: 40.5 % (ref 36.0–46.0)
HEMOGLOBIN: 13.4 g/dL (ref 12.0–15.0)
MCH: 29.5 pg (ref 26.0–34.0)
MCHC: 33.1 g/dL (ref 30.0–36.0)
MCV: 89 fL (ref 78.0–100.0)
Platelets: 260 10*3/uL (ref 150–400)
RBC: 4.55 MIL/uL (ref 3.87–5.11)
RDW: 13.1 % (ref 11.5–15.5)
WBC: 7.8 10*3/uL (ref 4.0–10.5)

## 2015-08-01 ENCOUNTER — Ambulatory Visit (HOSPITAL_COMMUNITY): Payer: Medicare Other | Admitting: Anesthesiology

## 2015-08-01 ENCOUNTER — Encounter (HOSPITAL_COMMUNITY): Payer: Self-pay | Admitting: *Deleted

## 2015-08-01 ENCOUNTER — Ambulatory Visit (HOSPITAL_COMMUNITY)
Admission: RE | Admit: 2015-08-01 | Discharge: 2015-08-01 | Disposition: A | Discharge status: Home / Self Care | Payer: Medicare Other | Source: Ambulatory Visit | Attending: Ophthalmology | Admitting: Ophthalmology

## 2015-08-01 ENCOUNTER — Encounter (HOSPITAL_COMMUNITY)
Admission: RE | Disposition: A | Discharge status: Home / Self Care | Payer: Self-pay | Source: Ambulatory Visit | Attending: Ophthalmology

## 2015-08-01 DIAGNOSIS — H2511 Age-related nuclear cataract, right eye: Secondary | ICD-10-CM | POA: Diagnosis not present

## 2015-08-01 DIAGNOSIS — Z951 Presence of aortocoronary bypass graft: Secondary | ICD-10-CM | POA: Insufficient documentation

## 2015-08-01 DIAGNOSIS — Z87891 Personal history of nicotine dependence: Secondary | ICD-10-CM | POA: Insufficient documentation

## 2015-08-01 DIAGNOSIS — I2510 Atherosclerotic heart disease of native coronary artery without angina pectoris: Secondary | ICD-10-CM | POA: Insufficient documentation

## 2015-08-01 DIAGNOSIS — Z7982 Long term (current) use of aspirin: Secondary | ICD-10-CM | POA: Insufficient documentation

## 2015-08-01 HISTORY — PX: CATARACT EXTRACTION W/PHACO: SHX586

## 2015-08-01 SURGERY — PHACOEMULSIFICATION, CATARACT, WITH IOL INSERTION
Anesthesia: Monitor Anesthesia Care | Site: Eye | Laterality: Right

## 2015-08-01 MED ORDER — MIDAZOLAM HCL 2 MG/2ML IJ SOLN
INTRAMUSCULAR | Status: AC
  Filled 2015-08-01: qty 2

## 2015-08-01 MED ORDER — BSS IO SOLN
INTRAOCULAR | Status: DC | PRN
  Administered 2015-08-01: 15 mL

## 2015-08-01 MED ORDER — CYCLOPENTOLATE-PHENYLEPHRINE 0.2-1 % OP SOLN
1.0000 [drp] | OPHTHALMIC | Status: AC
  Administered 2015-08-01 (×3): 1 [drp] via OPHTHALMIC

## 2015-08-01 MED ORDER — FENTANYL CITRATE (PF) 100 MCG/2ML IJ SOLN
INTRAMUSCULAR | Status: AC
  Filled 2015-08-01: qty 2

## 2015-08-01 MED ORDER — TETRACAINE HCL 0.5 % OP SOLN
1.0000 [drp] | OPHTHALMIC | Status: AC
  Administered 2015-08-01 (×3): 1 [drp] via OPHTHALMIC

## 2015-08-01 MED ORDER — LACTATED RINGERS IV SOLN
INTRAVENOUS | Status: DC
  Administered 2015-08-01: 07:00:00 via INTRAVENOUS

## 2015-08-01 MED ORDER — CYCLOPENTOLATE-PHENYLEPHRINE OP SOLN OPTIME - NO CHARGE
OPHTHALMIC | Status: AC
  Filled 2015-08-01: qty 2

## 2015-08-01 MED ORDER — TETRACAINE HCL 0.5 % OP SOLN
OPHTHALMIC | Status: AC
  Filled 2015-08-01: qty 2

## 2015-08-01 MED ORDER — POVIDONE-IODINE 5 % OP SOLN
OPHTHALMIC | Status: DC | PRN
  Administered 2015-08-01: 1 via OPHTHALMIC

## 2015-08-01 MED ORDER — LIDOCAINE HCL 3.5 % OP GEL: 1.0000 "application " | Freq: Once | OPHTHALMIC | Status: DC

## 2015-08-01 MED ORDER — LIDOCAINE HCL 3.5 % OP GEL
OPHTHALMIC | Status: AC
  Filled 2015-08-01: qty 1

## 2015-08-01 MED ORDER — MIDAZOLAM HCL 2 MG/2ML IJ SOLN
INTRAMUSCULAR | Status: AC
  Filled 2015-08-01: qty 4

## 2015-08-01 MED ORDER — MIDAZOLAM HCL 2 MG/2ML IJ SOLN
INTRAMUSCULAR | Status: DC | PRN
  Administered 2015-08-01 (×2): 1 mg via INTRAVENOUS

## 2015-08-01 MED ORDER — TETRACAINE 0.5 % OP SOLN OPTIME - NO CHARGE
OPHTHALMIC | Status: DC | PRN
  Administered 2015-08-01: 1 [drp] via OPHTHALMIC

## 2015-08-01 MED ORDER — PHENYLEPHRINE-KETOROLAC 1-0.3 % IO SOLN
INTRAOCULAR | Status: AC
  Filled 2015-08-01: qty 4

## 2015-08-01 MED ORDER — PHENYLEPHRINE-KETOROLAC 1-0.3 % IO SOLN
INTRAOCULAR | Status: DC | PRN
  Administered 2015-08-01: 500 mL via OPHTHALMIC

## 2015-08-01 MED ORDER — FENTANYL CITRATE (PF) 100 MCG/2ML IJ SOLN
25.0000 ug | INTRAMUSCULAR | Status: AC
  Administered 2015-08-01 (×2): 25 ug via INTRAVENOUS

## 2015-08-01 MED ORDER — LIDOCAINE HCL 3.5 % OP GEL
OPHTHALMIC | Status: DC | PRN
  Administered 2015-08-01: 1 via OPHTHALMIC

## 2015-08-01 MED ORDER — KETOROLAC TROMETHAMINE 0.5 % OP SOLN: 1.0000 [drp] | OPHTHALMIC | Status: DC | PRN

## 2015-08-01 MED ORDER — NA HYALUR & NA CHOND-NA HYALUR 0.55-0.5 ML IO KIT
PACK | INTRAOCULAR | Status: DC | PRN
  Administered 2015-08-01: 1 via OPHTHALMIC

## 2015-08-01 MED ORDER — MIDAZOLAM HCL 2 MG/2ML IJ SOLN
1.0000 mg | INTRAMUSCULAR | Status: DC | PRN
  Administered 2015-08-01: 2 mg via INTRAVENOUS

## 2015-08-01 SURGICAL SUPPLY — 8 items
CLOTH BEACON ORANGE TIMEOUT ST (SAFETY) ×3 IMPLANT
GLOVE BIOGEL PI IND STRL 6.5 (GLOVE) ×1 IMPLANT
GLOVE BIOGEL PI INDICATOR 6.5 (GLOVE) ×2
GLOVE EXAM NITRILE MD LF STRL (GLOVE) ×3 IMPLANT
INST SET CATARACT ~~LOC~~ (KITS) ×3 IMPLANT
LENS ALC ACRYL/TECN (Ophthalmic Related) ×3 IMPLANT
PAD ARMBOARD 7.5X6 YLW CONV (MISCELLANEOUS) ×3 IMPLANT
WATER STERILE IRR 250ML POUR (IV SOLUTION) ×3 IMPLANT

## 2015-08-01 NOTE — Discharge Instructions (Signed)
Amy Finley 08/01/2015 Dr. Iona Hansen Post operative Instructions for Cataract Patients  These instructions are for [name] and pertain to the operative eye.  1.  Resume your normal diet and previous oral medicines.  2. Your Follow-up appointment is at Dr. Iona Hansen' office in [place] on 08/02/15 @ 2:45 pm  3. You may leave the hospital when your driver is present and your nurse releases you.  4. Begin Pred Forte (prednisolone acetate 1%), Acular LS (ketorolac tromethamine .4%) and Gatifloxacin 0.5% eye drops; 1 drop each 4 times daily to operative eye. Begin 3 hours after discharge from Short Stay Unit.  Moxifloxacin 0.5% may be substituted for Gatifloxacin using the same instructions.  70. Page Dr. Iona Hansen via beeper [phone] for significant pain in or around operative eye that is not relieved by Tylenol.  6. If you took Plavix before surgery, restart it at the usual dose on the evening of surgery.  7. Wear dark glasses as necessary for excessive light sensitivity.  8. Do no forcefully rub you your operative eye.  9. Keep your operative eye dry for 1 week. You may gently clean your eyelids with a damp washcloth.  10. You may resume normal occupational activities in one week and resume driving as tolerated after the first post operative visit.  11. It is normal to have blurred vision and a scratchy sensation following surgery.  Dr. Iona Hansen: [phone]  PATIENT INSTRUCTIONS POST-ANESTHESIA  IMMEDIATELY FOLLOWING SURGERY:  Do not drive or operate machinery for the first twenty four hours after surgery.  Do not make any important decisions for twenty four hours after surgery or while taking narcotic pain medications or sedatives.  If you develop intractable nausea and vomiting or a severe headache please notify your doctor immediately.  FOLLOW-UP:  Please make an appointment with your surgeon as instructed. You do not need to follow up with anesthesia unless specifically instructed to do  so.  WOUND CARE INSTRUCTIONS (if applicable):  Keep a dry clean dressing on the anesthesia/puncture wound site if there is drainage.  Once the wound has quit draining you may leave it open to air.  Generally you should leave the bandage intact for twenty four hours unless there is drainage.  If the epidural site drains for more than 36-48 hours please call the anesthesia department.  QUESTIONS?:  Please feel free to call your physician or the hospital operator if you have any questions, and they will be happy to assist you.

## 2015-08-01 NOTE — Op Note (Signed)
08/01/2015  8:40 AM  PATIENT:  Amy Finley  74 y.o. female  PRE-OPERATIVE DIAGNOSIS:  nuclear cataract right eye  POST-OPERATIVE DIAGNOSIS:  nuclear cataract right eye  PROCEDURE:  Procedure(s): CATARACT EXTRACTION PHACO AND INTRAOCULAR LENS PLACEMENT ([place])  SURGEON:  Surgeon(s): Williams Che, MD  ASSISTANTS:   Cleda Clarks, CST  ANESTHESIA STAFF: Anesthesiologist: Lerry Liner, MD CRNA: Vista Deck, CRNA  ANESTHESIA:   topical and MAC  REQUESTED LENS POWER: 17.0  LENS IMPLANT INFORMATION:   Alcon SN60WF  S/n [id number]  Exp 02/2019  CUMULATIVE DISSIPATED ENERGY:7.96  INDICATIONS:see scanned office H&P for particulars  OP FINDINGS:dense NS  COMPLICATIONS:None  PROCEDURE:  The patient was brought to the operating room in good condition.  The operative eye was prepped and draped in the usual fashion for intraocular surgery.  Lidocaine gel was dropped onto the eye.  A 2.4 mm 10 O'clock near clear corneal stepped incision and a 12 O'clock stab incision were created.  Viscoat was instilled into the anterior chamber.  The 5 mm anterior capsulorhexis was performed with a bent needle cystotome and Utrata forceps.  The lens was hydrodissected and hydrodelineated with a cannula and balanced salt solution and rotated with a Kuglen hook.  Phacoemulsification was perfomed in the divide and conquer technique.  The remaining cortex was removed with I&A and the capsular surfaces polished as necessary.  Provisc was placed into the capsular bag and the lens inserted with the Alcon inserter.  The viscoelastic was removed with I&A and the lens "rocked" into position.  The wounds were hydrated and te anterior chamber was refilled with balanced salt solution.  The wounds were checked for leakage and rehydrated as necessary.  The lid speculum and drapes were removed and the patient was transported to short stay in good condition.  PATIENT DISPOSITION:  Short Stay

## 2015-08-01 NOTE — Transfer of Care (Signed)
Immediate Anesthesia Transfer of Care Note  Patient: Amy Finley  Procedure(s) Performed: Procedure(s) (LRB): CATARACT EXTRACTION PHACO AND INTRAOCULAR LENS PLACEMENT ([facility]) (Right)  Patient Location: Shortstay  Anesthesia Type: MAC  Level of Consciousness: awake  Airway & Oxygen Therapy: Patient Spontanous Breathing   Post-op Assessment: Report given to PACU RN, Post -op Vital signs reviewed and stable and Patient moving all extremities  Post vital signs: Reviewed and stable  Complications: No apparent anesthesia complications

## 2015-08-01 NOTE — Brief Op Note (Signed)
08/01/2015  8:40 AM  PATIENT:  Amy Finley  74 y.o. female  PRE-OPERATIVE DIAGNOSIS:  nuclear cataract right eye  POST-OPERATIVE DIAGNOSIS:  nuclear cataract right eye  PROCEDURE:  Procedure(s): CATARACT EXTRACTION PHACO AND INTRAOCULAR LENS PLACEMENT ([place])  SURGEON:  Surgeon(s): Williams Che, MD  ASSISTANTS:   Cleda Clarks, CST  ANESTHESIA STAFF: Anesthesiologist: Lerry Liner, MD CRNA: Vista Deck, CRNA  ANESTHESIA:   topical and MAC  REQUESTED LENS POWER: 17.0  LENS IMPLANT INFORMATION:   Alcon SN60WF  S/n [id number]  Exp 02/2019  CUMULATIVE DISSIPATED ENERGY:7.96  INDICATIONS:see scanned office H&P for particulars  OP FINDINGS:dense NS  COMPLICATIONS:None  DICTATION #: none  PLAN OF CARE:  As above  PATIENT DISPOSITION:  Short Stay

## 2015-08-01 NOTE — Anesthesia Postprocedure Evaluation (Signed)
  Anesthesia Post-op Note  Patient: Journe L Durbin  Procedure(s) Performed: Procedure(s) (LRB): CATARACT EXTRACTION PHACO AND INTRAOCULAR LENS PLACEMENT ([facility]) (Right)  Patient Location:  Short Stay  Anesthesia Type: MAC  Level of Consciousness: awake  Airway and Oxygen Therapy: Patient Spontanous Breathing  Post-op Pain: none  Post-op Assessment: Post-op Vital signs reviewed, Patient's Cardiovascular Status Stable, Respiratory Function Stable, Patent Airway, No signs of Nausea or vomiting and Pain level controlled  Post-op Vital Signs: Reviewed and stable  Complications: No apparent anesthesia complications

## 2015-08-01 NOTE — Anesthesia Procedure Notes (Signed)
Procedure Name: MAC Date/Time: 08/01/2015 7:47 AM Performed by: Vista Deck Pre-anesthesia Checklist: Patient identified, Emergency Drugs available, Suction available, Timeout performed and Patient being monitored Patient Re-evaluated:Patient Re-evaluated prior to inductionOxygen Delivery Method: Nasal Cannula

## 2015-08-01 NOTE — H&P (Signed)
I have reviewed the pre printed H&P, the patient was re-examined, and I have identified no significant interval changes in the patient's medical condition.  There is no change in the plan of care since the history and physical of record. 

## 2015-08-01 NOTE — Anesthesia Preprocedure Evaluation (Signed)
Anesthesia Evaluation  Patient identified by MRN, date of birth, ID band Patient awake    Reviewed: Allergy & Precautions, NPO status , Patient's Chart, lab work & pertinent test results  Airway Mallampati: II  TM Distance: >3 FB     Dental  (+) Teeth Intact   Pulmonary former smoker,    breath sounds clear to auscultation       Cardiovascular + CAD, + Past MI and + CABG   Rhythm:Regular Rate:Normal     Neuro/Psych    GI/Hepatic negative GI ROS,   Endo/Other    Renal/GU      Musculoskeletal   Abdominal   Peds  Hematology   Anesthesia Other Findings   Reproductive/Obstetrics                             Anesthesia Physical Anesthesia Plan  ASA: III  Anesthesia Plan: MAC   Post-op Pain Management:    Induction: Intravenous  Airway Management Planned: Nasal Cannula  Additional Equipment:   Intra-op Plan:   Post-operative Plan:   Informed Consent: I have reviewed the patients History and Physical, chart, labs and discussed the procedure including the risks, benefits and alternatives for the proposed anesthesia with the patient or authorized representative who has indicated his/her understanding and acceptance.     Plan Discussed with:   Anesthesia Plan Comments:         Anesthesia Quick Evaluation

## 2015-08-02 ENCOUNTER — Encounter (HOSPITAL_COMMUNITY): Payer: Self-pay | Admitting: Ophthalmology

## 2015-09-23 ENCOUNTER — Other Ambulatory Visit (HOSPITAL_COMMUNITY): Payer: Self-pay | Admitting: Internal Medicine

## 2015-09-23 DIAGNOSIS — Z1231 Encounter for screening mammogram for malignant neoplasm of breast: Secondary | ICD-10-CM

## 2015-09-29 ENCOUNTER — Ambulatory Visit (HOSPITAL_COMMUNITY)
Admission: RE | Admit: 2015-09-29 | Discharge: 2015-09-29 | Disposition: A | Discharge status: Home / Self Care | Payer: Medicare Other | Source: Ambulatory Visit | Attending: Internal Medicine | Admitting: Internal Medicine

## 2015-09-29 DIAGNOSIS — Z1231 Encounter for screening mammogram for malignant neoplasm of breast: Secondary | ICD-10-CM | POA: Diagnosis not present

## 2016-09-25 ENCOUNTER — Other Ambulatory Visit (HOSPITAL_COMMUNITY): Payer: Self-pay | Admitting: Internal Medicine

## 2016-09-25 DIAGNOSIS — Z1231 Encounter for screening mammogram for malignant neoplasm of breast: Secondary | ICD-10-CM

## 2016-09-28 ENCOUNTER — Ambulatory Visit (INDEPENDENT_AMBULATORY_CARE_PROVIDER_SITE_OTHER): Payer: Medicare Other | Admitting: Cardiovascular Disease

## 2016-09-28 ENCOUNTER — Encounter: Payer: Self-pay | Admitting: Cardiovascular Disease

## 2016-09-28 VITALS — BP 138/72 | HR 84 | Ht 63.0 in | Wt 152.6 lb

## 2016-09-28 DIAGNOSIS — I2583 Coronary atherosclerosis due to lipid rich plaque: Secondary | ICD-10-CM

## 2016-09-28 DIAGNOSIS — I2510 Atherosclerotic heart disease of native coronary artery without angina pectoris: Secondary | ICD-10-CM | POA: Diagnosis not present

## 2016-09-28 DIAGNOSIS — E7800 Pure hypercholesterolemia, unspecified: Secondary | ICD-10-CM | POA: Diagnosis not present

## 2016-09-28 NOTE — Patient Instructions (Signed)

## 2016-09-28 NOTE — Progress Notes (Signed)
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 Darylene Price April 07, 2010. She had a vein to her LAD and circumflex. 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 Myoview showed scar in the LAD territory. She has been completely asymptomatic. Her most recent lab work performed by her PCP 02/06/16 revealed total cholesterol of 52, LDL 73 and HDL is 65 not on statin therapy.   Current Outpatient Prescriptions  Medication Sig Dispense Refill  . Ascorbic Acid (VITAMIN C PO) Take 1 tablet by mouth daily.    Marland Kitchen aspirin EC 81 MG tablet Take 81 mg by mouth daily.    . B Complex-Biotin-FA (VITAMIN B50 COMPLEX PO) Take 1 capsule by mouth daily.    . Cholecalciferol (VITAMIN D3) 2000 UNITS TABS Take 2,000 Units by mouth daily.    Marland Kitchen loratadine (CLARITIN) 10 MG tablet Take 10 mg by mouth daily.    . Multiple Vitamins-Minerals (EYE VITAMINS & MINERALS) TABS Take 1 tablet by mouth 3 (three) times daily.    Marland Kitchen UBIQUINOL PO Take 1 tablet by mouth daily.     No current facility-administered medications for this visit.     Allergies  Allergen Reactions  . Erythromycin Other (See Comments)    Severe abdominal pain.    Social History   Social History  . Marital status: Divorced    Spouse name: N/A  . Number of children: 2  . Years of education: N/A   Occupational History  . Not on file.   Social History Main Topics  . Smoking  status: Former Smoker    Packs/day: 1.50    Years: 50.00    Types: Cigarettes    Quit date: 04/07/2010  . Smokeless tobacco: Never Used  . Alcohol use No  . Drug use: No  . Sexual activity: Not on file   Other Topics Concern  . Not on file   Social History Narrative  . No narrative on file     Review of Systems: General: negative for chills, fever, night sweats or weight changes.  Cardiovascular: negative for chest pain, dyspnea on exertion, edema, orthopnea, palpitations, paroxysmal nocturnal dyspnea or shortness of breath Dermatological: negative for rash Respiratory: negative for cough or wheezing Urologic: negative for hematuria Abdominal: negative for nausea, vomiting, diarrhea, bright red blood per rectum, melena, or hematemesis Neurologic: negative for visual changes, syncope, or dizziness All other systems reviewed and are otherwise negative except as noted above.    Blood pressure 138/72, pulse 84, height 5\' 3"  (1.6 m), weight 152 lb 9.6 oz (69.2 kg).  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 I personally reviewed this EKG  ASSESSMENT AND PLAN:   CAD (coronary artery disease) History of CAD status post emergency coronary bypass grafting X 2 by Dr. Roxy Manns 04/07/10 after she had left main dissection. Diagnostic cath. She did well prepped postoperatively. Her EF was 30-35% range which increased to 35-45% by subsequent echo 06/30/10. Her Myoview showed scar in the LAD territory. She is completely  asymptomatic.  Hyperlipidemia History of hyperlipidemia not on statin therapy with recent lipid profile performed by her PCP 02/06/16 revealing a total cholesterol of 52, LDL 73 and HDL of 65.      Lorretta Harp MD FACP,FACC,FAHA, [facility] 09/28/2016 11:44 AM

## 2016-09-28 NOTE — Assessment & Plan Note (Signed)
History of CAD status post emergency coronary bypass grafting X 2 by Dr. Roxy Manns 04/07/10 after she had left main dissection. Diagnostic cath. She did well prepped postoperatively. Her EF was 30-35% range which increased to 35-45% by subsequent echo 06/30/10. Her Myoview showed scar in the LAD territory. She is completely asymptomatic.

## 2016-09-28 NOTE — Assessment & Plan Note (Signed)
History of hyperlipidemia not on statin therapy with recent lipid profile performed by her PCP 02/06/16 revealing a total cholesterol of 52, LDL 73 and HDL of 65.

## 2016-10-03 ENCOUNTER — Ambulatory Visit (HOSPITAL_COMMUNITY)
Admission: RE | Admit: 2016-10-03 | Discharge: 2016-10-03 | Disposition: A | Discharge status: Home / Self Care | Payer: Medicare Other | Source: Ambulatory Visit | Attending: Internal Medicine | Admitting: Internal Medicine

## 2016-10-03 DIAGNOSIS — Z1231 Encounter for screening mammogram for malignant neoplasm of breast: Secondary | ICD-10-CM

## 2017-02-19 ENCOUNTER — Other Ambulatory Visit: Payer: Self-pay | Admitting: Internal Medicine

## 2017-02-19 DIAGNOSIS — Z87891 Personal history of nicotine dependence: Secondary | ICD-10-CM

## 2017-02-27 ENCOUNTER — Ambulatory Visit
Admission: RE | Admit: 2017-02-27 | Discharge: 2017-02-27 | Disposition: A | Discharge status: Home / Self Care | Payer: Medicare Other | Source: Ambulatory Visit | Attending: Internal Medicine | Admitting: Internal Medicine

## 2017-02-27 DIAGNOSIS — Z87891 Personal history of nicotine dependence: Secondary | ICD-10-CM

## 2017-05-31 ENCOUNTER — Emergency Department (HOSPITAL_COMMUNITY): Payer: Medicare Other

## 2017-05-31 ENCOUNTER — Encounter (HOSPITAL_COMMUNITY): Payer: Self-pay | Admitting: Emergency Medicine

## 2017-05-31 ENCOUNTER — Observation Stay (HOSPITAL_COMMUNITY)
Admission: EM | Admit: 2017-05-31 | Discharge: 2017-06-01 | Disposition: A | Discharge status: Home / Self Care | Payer: Medicare Other | Attending: Internal Medicine | Admitting: Internal Medicine

## 2017-05-31 DIAGNOSIS — Z8541 Personal history of malignant neoplasm of cervix uteri: Secondary | ICD-10-CM | POA: Insufficient documentation

## 2017-05-31 DIAGNOSIS — R079 Chest pain, unspecified: Principal | ICD-10-CM | POA: Diagnosis present

## 2017-05-31 DIAGNOSIS — I2510 Atherosclerotic heart disease of native coronary artery without angina pectoris: Secondary | ICD-10-CM | POA: Diagnosis present

## 2017-05-31 DIAGNOSIS — I252 Old myocardial infarction: Secondary | ICD-10-CM | POA: Insufficient documentation

## 2017-05-31 DIAGNOSIS — Z951 Presence of aortocoronary bypass graft: Secondary | ICD-10-CM | POA: Insufficient documentation

## 2017-05-31 DIAGNOSIS — Z87891 Personal history of nicotine dependence: Secondary | ICD-10-CM | POA: Insufficient documentation

## 2017-05-31 DIAGNOSIS — I2583 Coronary atherosclerosis due to lipid rich plaque: Secondary | ICD-10-CM

## 2017-05-31 LAB — BASIC METABOLIC PANEL
Anion gap: 7 (ref 5–15)
BUN: 19 mg/dL (ref 6–20)
CALCIUM: 8.8 mg/dL — AB (ref 8.9–10.3)
CHLORIDE: 104 mmol/L (ref 101–111)
CO2: 29 mmol/L (ref 22–32)
Creatinine, Ser: 0.81 mg/dL (ref 0.44–1.00)
GFR calc Af Amer: >60 mL/min (ref >60)
GFR calc non Af Amer: >60 mL/min (ref >60)
GLUCOSE: 132 mg/dL — AB (ref 65–99)
Potassium: 3.7 mmol/L (ref 3.5–5.1)
Sodium: 140 mmol/L (ref 135–145)

## 2017-05-31 LAB — HEPATIC FUNCTION PANEL
ALBUMIN: 4.2 g/dL (ref 3.5–5.0)
ALK PHOS: 65 U/L (ref 38–126)
ALT: 19 U/L (ref 14–54)
AST: 24 U/L (ref 15–41)
Bilirubin, Direct: 0.1 mg/dL (ref 0.1–0.5)
Indirect Bilirubin: 0.7 mg/dL (ref 0.3–0.9)
Total Bilirubin: 0.8 mg/dL (ref 0.3–1.2)
Total Protein: 7 g/dL (ref 6.5–8.1)

## 2017-05-31 LAB — CBC
HCT: 44.8 % (ref 36.0–46.0)
Hemoglobin: 14.7 g/dL (ref 12.0–15.0)
MCH: 29.3 pg (ref 26.0–34.0)
MCHC: 32.8 g/dL (ref 30.0–36.0)
MCV: 89.4 fL (ref 78.0–100.0)
PLATELETS: 253 10*3/uL (ref 150–400)
RBC: 5.01 MIL/uL (ref 3.87–5.11)
RDW: 13 % (ref 11.5–15.5)
WBC: 8.8 10*3/uL (ref 4.0–10.5)

## 2017-05-31 LAB — I-STAT TROPONIN, ED: TROPONIN I, POC: 0.01 ng/mL (ref 0.00–0.08)

## 2017-05-31 LAB — LIPASE, BLOOD: LIPASE: 39 U/L (ref 11–51)

## 2017-05-31 LAB — TROPONIN I

## 2017-05-31 MED ORDER — MORPHINE SULFATE (PF) 4 MG/ML IV SOLN: 1.0000 mg | INTRAVENOUS | Status: DC | PRN

## 2017-05-31 MED ORDER — ASPIRIN EC 81 MG PO TBEC
81.0000 mg | DELAYED_RELEASE_TABLET | Freq: Every day | ORAL | Status: DC
  Administered 2017-06-01: 81 mg via ORAL
  Filled 2017-05-31: qty 1

## 2017-05-31 MED ORDER — MAGNESIUM OXIDE 400 (241.3 MG) MG PO TABS
400.0000 mg | ORAL_TABLET | Freq: Every day | ORAL | Status: DC
  Administered 2017-06-01: 400 mg via ORAL
  Filled 2017-05-31: qty 1

## 2017-05-31 MED ORDER — NITROGLYCERIN 0.4 MG SL SUBL
0.4000 mg | SUBLINGUAL_TABLET | SUBLINGUAL | Status: DC | PRN
  Administered 2017-05-31: 0.4 mg via SUBLINGUAL
  Filled 2017-05-31: qty 1

## 2017-05-31 MED ORDER — ENOXAPARIN SODIUM 40 MG/0.4ML ~~LOC~~ SOLN
40.0000 mg | SUBCUTANEOUS | Status: DC
  Administered 2017-05-31: 40 mg via SUBCUTANEOUS
  Filled 2017-05-31: qty 0.4

## 2017-05-31 MED ORDER — NITROGLYCERIN 0.4 MG SL SUBL: 0.4000 mg | SUBLINGUAL_TABLET | SUBLINGUAL | Status: DC | PRN

## 2017-05-31 MED ORDER — LORATADINE 10 MG PO TABS
10.0000 mg | ORAL_TABLET | Freq: Every day | ORAL | Status: DC
  Administered 2017-06-01: 10 mg via ORAL
  Filled 2017-05-31: qty 1

## 2017-05-31 MED ORDER — FAMOTIDINE IN NACL 20-0.9 MG/50ML-% IV SOLN
20.0000 mg | Freq: Two times a day (BID) | INTRAVENOUS | Status: DC
  Administered 2017-05-31 – 2017-06-01 (×2): 20 mg via INTRAVENOUS
  Filled 2017-05-31 (×2): qty 50

## 2017-05-31 MED ORDER — VITAMIN D 1000 UNITS PO TABS
2000.0000 [IU] | ORAL_TABLET | Freq: Every day | ORAL | Status: DC
  Administered 2017-06-01: 2000 [IU] via ORAL
  Filled 2017-05-31: qty 2

## 2017-05-31 MED ORDER — ACETAMINOPHEN 325 MG PO TABS: 650.0000 mg | ORAL_TABLET | ORAL | Status: DC | PRN

## 2017-05-31 MED ORDER — ONDANSETRON HCL 4 MG/2ML IJ SOLN: 4.0000 mg | Freq: Four times a day (QID) | INTRAMUSCULAR | Status: DC | PRN

## 2017-05-31 NOTE — ED Triage Notes (Signed)
Patient c/o left side chest pain that radiates into back. Patient reports being diaphoretic with nausea and "slight dizziness". Patient took a 325mg  aspirin at 1pm. Patient has extensive cardiac hx.

## 2017-05-31 NOTE — ED Provider Notes (Signed)
Emergency Department Provider Note   I have reviewed the triage vital signs and the nursing notes.   HISTORY  Chief Complaint Chest Pain   HPI Amy Finley is a 76 y.o. female past medical history of coronary artery disease, hyperlipidemia and myocardial infarction status post stent 2, also complicated by a left main dissection subsequently requiring CABG presents to the emergency department today with chest pain. Patient states that she was in her normal state of health until she woke up this morning she felt "off". She cannot explain it better than that. She states that she does relate some collar greens and brought that she did eat last night and indigestion however the symptoms did not go away. She started having a dull ache in her back and a dull ache in her chest as well. She started with a cold sweat a lot hour prior to arrival and this is what brought her to come in for evaluation as these were similar symptoms to previous heart attack. She tried aspirin which did not seem to make the symptoms better or worse. She triedeating euro she denies any symptoms better or worse. Exertion did not worsen her symptoms and rest does not help him. No radiation of the pain otherwise. No nausea or lightheadedness. No recent fevers, cough or infectious symptoms. No leg swelling. No history of recent surgery or long car rides or blood clots. No other associated or modifying symptoms.    Past Medical History:  Diagnosis Date  . Cervical cancer ([place])    remote history at age 32  . Coronary artery disease    Myocardial infarction ST segment elevation treated with PCI and stenting of her apical LAD complicated by left main dissection requiring emergency CABG x 2 04/07/10  . Hyperlipidemia, mild    statin intolerant    Patient Active Problem List   Diagnosis Date Noted  . Hyperlipidemia 06/22/2014  . CAD (coronary artery disease) 02/13/2013  . CLOSED FRACTURE OF OTHER BONE OF WRIST 03/08/2010     Past Surgical History:  Procedure Laterality Date  . CARDIAC CATHETERIZATION  04/07/10     1.5 x 12 Flex balloon angioplasty.  angioplasty performed in the entire distal third ever restoring apical flow.  2.0 x 18 mini Vision stent deployed.  Dissection in proximal LAD extending back into left main and cirumflex.  Marland Kitchen CATARACT EXTRACTION W/PHACO Left 03/29/2015   Procedure: CATARACT EXTRACTION PHACO AND INTRAOCULAR LENS PLACEMENT;;  Surgeon: Williams Che, MD;  Location: [facility];  Service: Ophthalmology;  Laterality: Left;  CDE 7.86  . CATARACT EXTRACTION W/PHACO Right 08/01/2015   Procedure: CATARACT EXTRACTION PHACO AND INTRAOCULAR LENS PLACEMENT (IOC);  Surgeon: Williams Che, MD;  Location: [facility];  Service: Ophthalmology;  Laterality: Right;  CDE: 7.96  . CORONARY ARTERY BYPASS GRAFT  04/07/10   (emergency)  x2 using saphenous vein graft to mid left anterior descending coronary artery, saphenous vein graft to obtuse marginal branch and left circumflex coronary artery   . ORIF WRIST FRACTURE Left 2011  . TOTAL ABDOMINAL HYSTERECTOMY  1976    Current Outpatient Rx  . Order #: [id number] Class: Historical Med  . Order #: [id number] Class: Historical Med  . Order #: [id number] Class: Historical Med  . Order #: [id number] Class: Historical Med  . Order #: [id number] Class: Historical Med  . Order #: [id number] Class: Historical Med  . Order #: [id number] Class: Historical Med  . Order #: [id number] Class: Historical Med  . Order #: [id number] Class: Historical Med  . Order #:  [id number] Class: Historical Med    Allergies Erythromycin  Family History  Problem Relation Age of Onset  . Cancer Mother   . Diabetes Mother   . Congestive Heart Failure Father     Social History Social History  Substance Use Topics  . Smoking status: Former Smoker    Packs/day: 1.50    Years: 50.00    Types: Cigarettes    Quit date: 04/07/2010  . Smokeless tobacco: Never Used  . Alcohol use No    Review of  Systems  All other systems negative except as documented in the HPI. All pertinent positives and negatives as reviewed in the HPI. ____________________________________________  PHYSICAL EXAM:  VITAL SIGNS: ED Triage Vitals  Enc Vitals Group     BP      Pulse      Resp      Temp      Temp src      SpO2      Weight      Height      Head Circumference      Peak Flow      Pain Score      Pain Loc      Pain Edu?      Excl. in [place]?     Constitutional: Alert and oriented. Well appearing and in no acute distress. Eyes: Conjunctivae are normal. PERRL. EOMI. Head: Atraumatic. Nose: No congestion/rhinnorhea. Mouth/Throat: Mucous membranes are moist.  Oropharynx non-erythematous. Neck: No stridor.  No meningeal signs.   Cardiovascular: Normal rate, regular rhythm. Good peripheral circulation. Grossly normal heart sounds.   Respiratory: Normal respiratory effort.  No retractions. Lungs CTAB. Gastrointestinal: Soft and nontender. No distention.  Musculoskeletal: No lower extremity tenderness nor edema. No gross deformities of extremities. Neurologic:  Normal speech and language. No gross focal neurologic deficits are appreciated.  Skin:  Skin is warm, dry and intact. No rash noted.  ____________________________________________   LABS (all labs ordered are listed, but only abnormal results are displayed)  Labs Reviewed  BASIC METABOLIC PANEL - Abnormal; Notable for the following:       Result Value   Glucose, Bld 132 (*)    Calcium 8.8 (*)    All other components within normal limits  CBC  HEPATIC FUNCTION PANEL  LIPASE, BLOOD  I-STAT TROPONIN, ED   ____________________________________________  EKG   EKG Interpretation  Date/Time:  Friday May 31 2017 15:35:50 EDT Ventricular Rate:  78 PR Interval:    QRS Duration: 140 QT Interval:  406 QTC Calculation: 463 R Axis:   114 Text Interpretation:  Sinus rhythm Ventricular premature complex Left atrial enlargement  RBBB and LPFB Anterior infarct, old Baseline wander in lead(s) V3 V5 LPFB new since 2016 other ST changes likely related to IVCD and poor baseline wander Confirmed by Merrily Pew [phone]) on 05/31/2017 3:42:53 PM        EKG Interpretation  Date/Time:  Friday May 31 2017 15:35:50 EDT Ventricular Rate:  78 PR Interval:    QRS Duration: 140 QT Interval:  406 QTC Calculation: 463 R Axis:   114 Text Interpretation:  Sinus rhythm Ventricular premature complex Left atrial enlargement RBBB and LPFB Anterior infarct, old Baseline wander in lead(s) V3 V5 LPFB new since 2016 other ST changes likely related to IVCD and poor baseline wander Confirmed by Merrily Pew [phone]) on 05/31/2017 3:42:53 PM       ____________________________________________  RADIOLOGY  Dg Chest 2 View  Result Date: 05/31/2017 CLINICAL DATA:  Chest pain. EXAM:  CHEST  2 VIEW COMPARISON:  Radiographs of May 01, 2010. FINDINGS: The heart size and mediastinal contours are within normal limits. Sternotomy wires are noted. No pneumothorax or pleural effusion is noted. Both lungs are clear. The visualized skeletal structures are unremarkable. IMPRESSION: No active cardiopulmonary disease. Electronically Signed   By: Marijo Conception, M.D.   On: 05/31/2017 16:03   ____________________________________________   PROCEDURES  Procedure(s) performed:   Procedures ____________________________________________   INITIAL IMPRESSION / ASSESSMENT AND PLAN / ED COURSE  Pertinent labs & imaging results that were available during my care of the patient were reviewed by me and considered in my medical decision making (see chart for details).  Concern for ACS, will eval appropriately. - ASA already taken at home (325), will give NTG Also on ddx is pancreatitis/PUD/indigestion/choledocholithiasis/cholecystitis. Will add on LFT/lipase. Needs repeat ECG.   Patient with total relief of symptoms with NTG. Troponin 0.01. Plan for  hospitalist admission for concern of NSTEMI/unstable angina. Pain free, no ecg changes, will hold on heparin for now.   ____________________________________________  FINAL CLINICAL IMPRESSION(S) / ED DIAGNOSES  Final diagnoses:  Nonspecific chest pain    MEDICATIONS GIVEN DURING THIS VISIT:  Medications  nitroGLYCERIN (NITROSTAT) SL tablet 0.4 mg (0.4 mg Sublingual Given 05/31/17 1620)    NEW OUTPATIENT MEDICATIONS STARTED DURING THIS VISIT:  New Prescriptions   No medications on file    Note:  This document was prepared using Dragon voice recognition software and may include unintentional dictation errors.    Dong Nimmons, Corene Cornea, MD 06/01/17 [phone]

## 2017-05-31 NOTE — ED Notes (Signed)
From radiology 

## 2017-05-31 NOTE — H&P (Signed)
History and Physical    Amy Finley KGU:[id number] DOB: September 14, 1941 DOA: 05/31/2017  PCP: [name], MD   Patient coming from: Home  Chief Complaint: Chest pain   HPI: Amy Finley is a 76 y.o. female with medical history significant for coronary artery disease status post stenting and subsequent emergent CABG for left main dissection in 2011, now presenting to the emergency department for evaluation of chest pain. Patient reports that she experienced some indigestion last night after eating some collard greens, then woke this morning with a nonspecific malaise. She went on to develop a dull ache in the chest and back with no appreciable alleviating or exacerbating factors. This persisted throughout the day and, shortly prior to her arrival, she developed some sweats and nausea. Denies any cough for significant dyspnea. No edema. Denies any leg tenderness or palpitations. No prolonged immobilization, and no history of VTE. She took a full dose aspirin at home today.  ED Course: Upon arrival to the ED, patient is found to be afebrile, saturating well on room air, and with vitals otherwise stable. EKG features a sinus rhythm with PVC, RBBB, and LPFB; no significant change from prior. Chest x-ray is negative for acute cardiopulmonary disease. Chemistry panel is unremarkable and the CBC is within normal limits. Troponin is normal at 0.01. Patient was treated with nitroglycerin in the emergency department and enjoyed resolution in her presenting complaints. She remained hemodynamically stable and in no apparent respiratory distress. She will be observed on the telemetry unit for ongoing evaluation and management of chest pain with a history of CAD, suspected to be GI related, though relieved by nitroglycerin and in a patient with known CAD.  Review of Systems:  All other systems reviewed and apart from HPI, are negative.  Past Medical History:  Diagnosis Date  . Cervical cancer ([place])    remote  history at age 48  . Coronary artery disease    Myocardial infarction ST segment elevation treated with PCI and stenting of her apical LAD complicated by left main dissection requiring emergency CABG x 2 04/07/10  . Hyperlipidemia, mild    statin intolerant    Past Surgical History:  Procedure Laterality Date  . CARDIAC CATHETERIZATION  04/07/10     1.5 x 12 Flex balloon angioplasty.  angioplasty performed in the entire distal third ever restoring apical flow.  2.0 x 18 mini Vision stent deployed.  Dissection in proximal LAD extending back into left main and cirumflex.  Marland Kitchen CATARACT EXTRACTION W/PHACO Left 03/29/2015   Procedure: CATARACT EXTRACTION PHACO AND INTRAOCULAR LENS PLACEMENT;;  Surgeon: Williams Che, MD;  Location: [facility];  Service: Ophthalmology;  Laterality: Left;  CDE 7.86  . CATARACT EXTRACTION W/PHACO Right 08/01/2015   Procedure: CATARACT EXTRACTION PHACO AND INTRAOCULAR LENS PLACEMENT (IOC);  Surgeon: Williams Che, MD;  Location: [facility];  Service: Ophthalmology;  Laterality: Right;  CDE: 7.96  . CORONARY ARTERY BYPASS GRAFT  04/07/10   (emergency)  x2 using saphenous vein graft to mid left anterior descending coronary artery, saphenous vein graft to obtuse marginal branch and left circumflex coronary artery   . ORIF WRIST FRACTURE Left 2011  . TOTAL ABDOMINAL HYSTERECTOMY  1976     reports that she quit smoking about 7 years ago. Her smoking use included Cigarettes. She has a 75.00 pack-year smoking history. She has never used smokeless tobacco. She reports that she does not drink alcohol or use drugs.  Allergies  Allergen Reactions  . Erythromycin Other (  See Comments)    Severe abdominal pain.    Family History  Problem Relation Age of Onset  . Cancer Mother   . Diabetes Mother   . Congestive Heart Failure Father      Prior to Admission medications   Medication Sig Start Date End Date Taking? Authorizing Provider  Ascorbic Acid (VITAMIN C PO) Take 1 tablet  by mouth daily.   Yes [provider]  aspirin 325 MG tablet Take 325 mg by mouth daily.   Yes [provider]  aspirin EC 81 MG tablet Take 81 mg by mouth daily.   Yes [provider]  B Complex-Biotin-FA (VITAMIN B50 COMPLEX PO) Take 1 capsule by mouth daily.   Yes [provider]  Cholecalciferol (VITAMIN D3) 2000 UNITS TABS Take 2,000 Units by mouth daily.   Yes [provider]  loratadine (CLARITIN) 10 MG tablet Take 10 mg by mouth daily.   Yes [provider]  magnesium oxide (MAG-OX) 400 MG tablet Take 400 mg by mouth daily.   Yes [provider]  pseudoephedrine (SUDAFED) 60 MG tablet Take 60 mg by mouth every 4 (four) hours as needed for congestion.   Yes [provider]  SPIRULINA PO Take 1 tablet by mouth daily.   Yes [provider]  UBIQUINOL PO Take 1 tablet by mouth daily.   Yes [provider]    Physical Exam: Vitals:   05/31/17 1600 05/31/17 1630 05/31/17 1645 05/31/17 1700  BP: (!) 142/62 103/62 90/70 111/61  Pulse: 72 76 68 65  Resp: 19 (!) 9 10 10   Temp:      TempSrc:      SpO2: 95% 93% 95% 94%  Weight:      Height:          Constitutional: NAD, calm, comfortable Eyes: PERTLA, lids and conjunctivae normal ENMT: Mucous membranes are moist. Posterior pharynx clear of any exudate or lesions.   Neck: normal, supple, no masses, no thyromegaly Respiratory: clear to auscultation bilaterally, no wheezing, no crackles. Normal respiratory effort.   Cardiovascular: S1 & S2 heard, regular rate and rhythm. No extremity edema. No significant JVD. Abdomen: No distension, no tenderness, no masses palpated. Bowel sounds normal.  Musculoskeletal: no clubbing / cyanosis. No joint deformity upper and lower extremities.    Skin: Dermatitis upper chest. Warm, dry, well-perfused. Neurologic: CN 2-12 grossly intact. Sensation intact. Strength 5/5 in all 4 limbs.  Psychiatric: Alert and oriented  x 3. Very pleasant and cooperative.     Labs on Admission: I have personally reviewed following labs and imaging studies  CBC:  Recent Labs Lab 05/31/17 1538  WBC 8.8  HGB 14.7  HCT 44.8  MCV 89.4  PLT 578   Basic Metabolic Panel:  Recent Labs Lab 05/31/17 1538  NA 140  K 3.7  CL 104  CO2 29  GLUCOSE 132*  BUN 19  CREATININE 0.81  CALCIUM 8.8*   GFR: Estimated Creatinine Clearance: 56.8 mL/min (by C-G formula based on SCr of 0.81 mg/dL). Liver Function Tests:  Recent Labs Lab 05/31/17 1553  AST 24  ALT 19  ALKPHOS 65  BILITOT 0.8  PROT 7.0  ALBUMIN 4.2    Recent Labs Lab 05/31/17 1553  LIPASE 39   No results for input(s): AMMONIA in the last 168 hours. Coagulation Profile: No results for input(s): INR, PROTIME in the last 168 hours. Cardiac Enzymes: No results for input(s): CKTOTAL, CKMB, CKMBINDEX, TROPONINI in the last 168 hours. BNP (  last 3 results) No results for input(s): PROBNP in the last 8760 hours. HbA1C: No results for input(s): HGBA1C in the last 72 hours. CBG: No results for input(s): GLUCAP in the last 168 hours. Lipid Profile: No results for input(s): CHOL, HDL, LDLCALC, TRIG, CHOLHDL, LDLDIRECT in the last 72 hours. Thyroid Function Tests: No results for input(s): TSH, T4TOTAL, FREET4, T3FREE, THYROIDAB in the last 72 hours. Anemia Panel: No results for input(s): VITAMINB12, FOLATE, FERRITIN, TIBC, IRON, RETICCTPCT in the last 72 hours. Urine analysis: No results found for: COLORURINE, APPEARANCEUR, LABSPEC, PHURINE, GLUCOSEU, HGBUR, BILIRUBINUR, KETONESUR, PROTEINUR, UROBILINOGEN, NITRITE, LEUKOCYTESUR Sepsis Labs: @LABRCNTIP (procalcitonin:4,lacticidven:4) )No results found for this or any previous visit (from the past 240 hour(s)).   Radiological Exams on Admission: Dg Chest 2 View  Result Date: 05/31/2017 CLINICAL DATA:  Chest pain. EXAM: CHEST  2 VIEW COMPARISON:  Radiographs of May 01, 2010. FINDINGS: The heart size and  mediastinal contours are within normal limits. Sternotomy wires are noted. No pneumothorax or pleural effusion is noted. Both lungs are clear. The visualized skeletal structures are unremarkable. IMPRESSION: No active cardiopulmonary disease. Electronically Signed   By: Marijo Conception, M.D.   On: 05/31/2017 16:03    EKG: Independently reviewed. Sinus rhythm, PVC, RBBB, LPFB. No significant change from prior.   Assessment/Plan  1. Chest pain, CAD  - Patient has hx of CAD status-post stenting remotely and emergent CABG in 2011 for left main dissection - Presents today with chest pain, took ASA 325 at home, and relieved in ED after NTG - ED workup with unchanged EKG, troponin 0.01, and unremarkable CXR   - Description is somewhat atypical, sounds more consistent with GI-etiology and empiric Pepcid will be given; notably, however, pain was relieved with NTG in ED and she does have known CAD - Plan to monitor on telemetry for ischemic changes, obtain serial troponin measurements, repeat EKG, continue ASA  - She has been intolerant of statin in past and beta-blocker held for low DBP     DVT prophylaxis: sq Lovenox Code Status: Full  Family Communication: Discussed with patient Disposition Plan: Observe on telemetry Consults called: None Admission status: Observation    Vianne Bulls, MD [facility] Pager [phone]  If 7PM-7AM, please contact night-coverage [email or web address] Password [email or web address]  05/31/2017, 5:50 PM

## 2017-06-01 DIAGNOSIS — R079 Chest pain, unspecified: Secondary | ICD-10-CM

## 2017-06-01 DIAGNOSIS — I2510 Atherosclerotic heart disease of native coronary artery without angina pectoris: Secondary | ICD-10-CM

## 2017-06-01 LAB — CBC
HEMATOCRIT: 43.7 % (ref 36.0–46.0)
Hemoglobin: 14.2 g/dL (ref 12.0–15.0)
MCH: 29 pg (ref 26.0–34.0)
MCHC: 32.5 g/dL (ref 30.0–36.0)
MCV: 89.2 fL (ref 78.0–100.0)
Platelets: 229 10*3/uL (ref 150–400)
RBC: 4.9 MIL/uL (ref 3.87–5.11)
RDW: 13 % (ref 11.5–15.5)
WBC: 7.1 10*3/uL (ref 4.0–10.5)

## 2017-06-01 LAB — BASIC METABOLIC PANEL
Anion gap: 7 (ref 5–15)
BUN: 14 mg/dL (ref 6–20)
CALCIUM: 8.8 mg/dL — AB (ref 8.9–10.3)
CO2: 30 mmol/L (ref 22–32)
CREATININE: 0.74 mg/dL (ref 0.44–1.00)
Chloride: 106 mmol/L (ref 101–111)
GFR calc non Af Amer: >60 mL/min (ref >60)
Glucose, Bld: 94 mg/dL (ref 65–99)
Potassium: 4.3 mmol/L (ref 3.5–5.1)
SODIUM: 143 mmol/L (ref 135–145)

## 2017-06-01 LAB — TROPONIN I

## 2017-06-01 MED ORDER — FAMOTIDINE 20 MG PO TABS: 20.0000 mg | ORAL_TABLET | Freq: Two times a day (BID) | ORAL | 1 refills | Status: DC

## 2017-06-01 NOTE — Discharge Summary (Signed)
Physician Discharge Summary  Amy Finley:[id number] DOB: 06/21/1941 DOA: 05/31/2017  PCP: [name], MD  Admit date: 05/31/2017 Discharge date: 06/01/2017  Time spent: 45 minutes  Recommendations for Outpatient Follow-up:  -Will be discharged home today. -Advised to follow up with cardiologist in 2 weeks.   Discharge Diagnoses:  Principal Problem:   Chest pain Active Problems:   CAD (coronary artery disease)   Discharge Condition: Stable and improved  Filed Weights   05/31/17 1536  Weight: 68.9 kg (152 lb)    History of present illness:  As per Dr. Myna Hidalgo on 9/7: Amy Finley is a 76 y.o. female with medical history significant for coronary artery disease status post stenting and subsequent emergent CABG for left main dissection in 2011, now presenting to the emergency department for evaluation of chest pain. Patient reports that she experienced some indigestion last night after eating some collard greens, then woke this morning with a nonspecific malaise. She went on to develop a dull ache in the chest and back with no appreciable alleviating or exacerbating factors. This persisted throughout the day and, shortly prior to her arrival, she developed some sweats and nausea. Denies any cough for significant dyspnea. No edema. Denies any leg tenderness or palpitations. No prolonged immobilization, and no history of VTE. She took a full dose aspirin at home today.  ED Course: Upon arrival to the ED, patient is found to be afebrile, saturating well on room air, and with vitals otherwise stable. EKG features a sinus rhythm with PVC, RBBB, and LPFB; no significant change from prior. Chest x-ray is negative for acute cardiopulmonary disease. Chemistry panel is unremarkable and the CBC is within normal limits. Troponin is normal at 0.01. Patient was treated with nitroglycerin in the emergency department and enjoyed resolution in her presenting complaints. She remained hemodynamically  stable and in no apparent respiratory distress. She will be observed on the telemetry unit for ongoing evaluation and management of chest pain with a history of CAD, suspected to be GI related, though relieved by nitroglycerin and in a patient with known CAD.  Hospital Course:   Chest Pain -Has ruled out for ACS with negative troponins and EKG without acute ischemic changes. -No further CP. -She thinks the Pepcid has made the difference. -Have advised her to continue pepcid for now.  Rest of chronic conditions are stable.  Procedures:  None   Consultations:  None  Discharge Instructions  Discharge Instructions    Diet - low sodium heart healthy    Complete by:  As directed    Increase activity slowly    Complete by:  As directed      Allergies as of 06/01/2017      Reactions   Erythromycin Other (See Comments)   Severe abdominal pain.      Medication List    STOP taking these medications   pseudoephedrine 60 MG tablet Commonly known as:  SUDAFED     TAKE these medications   aspirin EC 81 MG tablet Take 81 mg by mouth daily.   aspirin 325 MG tablet Take 325 mg by mouth daily.   famotidine 20 MG tablet Commonly known as:  PEPCID Take 1 tablet (20 mg total) by mouth 2 (two) times daily.   loratadine 10 MG tablet Commonly known as:  CLARITIN Take 10 mg by mouth daily.   magnesium oxide 400 MG tablet Commonly known as:  MAG-OX Take 400 mg by mouth daily.   SPIRULINA PO Take  1 tablet by mouth daily.   UBIQUINOL PO Take 1 tablet by mouth daily.   VITAMIN B50 COMPLEX PO Take 1 capsule by mouth daily.   VITAMIN C PO Take 1 tablet by mouth daily.   Vitamin D3 2000 units Tabs Take 2,000 Units by mouth daily.            Discharge Care Instructions        Start     Ordered   06/01/17 0000  famotidine (PEPCID) 20 MG tablet  2 times daily     06/01/17 1209   06/01/17 0000  Increase activity slowly     06/01/17 1209   06/01/17 0000  Diet - low  sodium heart healthy     06/01/17 1209     Allergies  Allergen Reactions  . Erythromycin Other (See Comments)    Severe abdominal pain.   Follow-up Information    [name], MD. Schedule an appointment as soon as possible for a visit in 2 week(s).   Specialty:  Internal Medicine Contact information: [address] [employer] [place] [address] [phone]            The results of significant diagnostics from this hospitalization (including imaging, microbiology, ancillary and laboratory) are listed below for reference.    Significant Diagnostic Studies: Dg Chest 2 View  Result Date: 05/31/2017 CLINICAL DATA:  Chest pain. EXAM: CHEST  2 VIEW COMPARISON:  Radiographs of May 01, 2010. FINDINGS: The heart size and mediastinal contours are within normal limits. Sternotomy wires are noted. No pneumothorax or pleural effusion is noted. Both lungs are clear. The visualized skeletal structures are unremarkable. IMPRESSION: No active cardiopulmonary disease. Electronically Signed   By: Marijo Conception, M.D.   On: 05/31/2017 16:03    Microbiology: No results found for this or any previous visit (from the past 240 hour(s)).   Labs: Basic Metabolic Panel:  Recent Labs Lab 05/31/17 1538 06/01/17 0641  NA 140 143  K 3.7 4.3  CL 104 106  CO2 29 30  GLUCOSE 132* 94  BUN 19 14  CREATININE 0.81 0.74  CALCIUM 8.8* 8.8*   Liver Function Tests:  Recent Labs Lab 05/31/17 1553  AST 24  ALT 19  ALKPHOS 65  BILITOT 0.8  PROT 7.0  ALBUMIN 4.2    Recent Labs Lab 05/31/17 1553  LIPASE 39   No results for input(s): AMMONIA in the last 168 hours. CBC:  Recent Labs Lab 05/31/17 1538 06/01/17 0641  WBC 8.8 7.1  HGB 14.7 14.2  HCT 44.8 43.7  MCV 89.4 89.2  PLT 253 229   Cardiac Enzymes:  Recent Labs Lab 05/31/17 1808 06/01/17 0048 06/01/17 0641  TROPONINI <0.03 <0.03 <0.03   BNP: BNP (last 3 results) No results for input(s): BNP in the last 8760  hours.  ProBNP (last 3 results) No results for input(s): PROBNP in the last 8760 hours.  CBG: No results for input(s): GLUCAP in the last 168 hours.     SignedLelon Frohlich  [facility] Pager: [phone] 06/01/2017, 12:09 PM

## 2017-06-01 NOTE — Progress Notes (Signed)
Patient alert and oriented. Vital signs are stable. Saline lock removed. Patient up ad lib. Denies pain. Discharge instructions given. Patient verbalized understanding of instructions. Patient left floor via wheelchair accompanied by spouse and nursing staff.

## 2017-06-27 ENCOUNTER — Telehealth: Payer: Self-pay | Admitting: Cardiovascular Disease

## 2017-06-27 NOTE — Telephone Encounter (Signed)
Received records from Dr [name] for appointment on 07/30/17 with Dr Gwenlyn Found.  Records put with Dr Kennon Holter schedule for 07/30/17. lp

## 2017-07-30 ENCOUNTER — Encounter: Payer: Self-pay | Admitting: Cardiovascular Disease

## 2017-07-30 ENCOUNTER — Ambulatory Visit: Payer: Medicare Other | Admitting: Cardiovascular Disease

## 2017-07-30 DIAGNOSIS — I2510 Atherosclerotic heart disease of native coronary artery without angina pectoris: Secondary | ICD-10-CM | POA: Diagnosis not present

## 2017-07-30 DIAGNOSIS — E7800 Pure hypercholesterolemia, unspecified: Secondary | ICD-10-CM

## 2017-07-30 NOTE — Assessment & Plan Note (Signed)
History of hyperlipidemia on statin therapy with recent lipid profile performed 02/11/17 revealed total cholesterol 156, LDL 74 and HDL of 65.

## 2017-07-30 NOTE — Progress Notes (Signed)
07/30/2017 Amy Finley   April 13, 1941  [id number]  Primary Physician [name], MD Primary Cardiologist: Lorretta Harp MD Lupe Carney, [place]  HPI:  Amy Finley is a 76 y.o. female thin-appearing, married Caucasian female, mother of 2 children who I last saw her in the office 09/28/16.Amy Finley She has a history of ST-segment-elevation myocardial infarction treated with PCI and stenting of her apical LAD which is complicated by a left main dissection requiring emergency coronary artery bypass grafting x2 by Dr. Darylene Price April 07, 2010. She had a vein to her LAD and circumflex. 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 Myoview showed scar in the LAD territory. She has been completely asymptomatic. Her most recent lab work performed by her PCP 02/11/17 revealed total cholesterol 156, LDL 74 and HDL 65. She was recently seen in the [facility] ER overnight for atypical chest pain 05/31/17. She does admit to being under a lot of stress at home and had eaten collar greens a day before. Her enzymes were negative. EKG showed no acute change and she was discharged home. She's had no recurrent symptoms.   Current Meds  Medication Sig  . aspirin EC 81 MG tablet Take 81 mg by mouth daily.  . B Complex-Biotin-FA (VITAMIN B50 COMPLEX PO) Take 1 capsule by mouth daily.  . Cholecalciferol (VITAMIN D3) 2000 UNITS TABS Take 2,000 Units by mouth daily.  . cromolyn (NASALCROM) 5.2 MG/ACT nasal spray Place 1 spray as directed into both nostrils.  . famotidine (PEPCID) 20 MG tablet Take 1 tablet (20 mg total) by mouth 2 (two) times daily.  . magnesium oxide (MAG-OX) 400 MG tablet Take 400 mg by mouth daily.     Allergies  Allergen Reactions  . Erythromycin Other (See Comments)    Severe abdominal pain.    Social History   Socioeconomic History  . Marital status: Divorced    Spouse name: Not on  file  . Number of children: 2  . Years of education: Not on file  . Highest education level: Not on file  Social Needs  . Financial resource strain: Not on file  . Food insecurity - worry: Not on file  . Food insecurity - inability: Not on file  . Transportation needs - medical: Not on file  . Transportation needs - non-medical: Not on file  Occupational History  . Not on file  Tobacco Use  . Smoking status: Former Smoker    Packs/day: 1.50    Years: 50.00    Pack years: 75.00    Types: Cigarettes    Last attempt to quit: 04/07/2010    Years since quitting: 7.3  . Smokeless tobacco: Never Used  Substance and Sexual Activity  . Alcohol use: No  . Drug use: No  . Sexual activity: Not on file  Other Topics Concern  . Not on file  Social History Narrative  . Not on file     Review of Systems: General: negative for chills, fever, night sweats or weight changes.  Cardiovascular: negative for chest pain, dyspnea on exertion, edema, orthopnea, palpitations, paroxysmal nocturnal dyspnea or shortness of breath Dermatological: negative for rash Respiratory: negative for cough or wheezing Urologic: negative for hematuria Abdominal: negative for nausea, vomiting, diarrhea, bright red blood per rectum, melena, or hematemesis Neurologic: negative for visual changes, syncope, or dizziness All other systems reviewed and are  otherwise negative except as noted above.    Blood pressure 131/68, pulse 87, height 5\' 3"  (1.6 m), weight 152 lb 6.4 oz (69.1 kg), SpO2 94 %.  General appearance: alert and no distress Neck: no adenopathy, no carotid bruit, no JVD, supple, symmetrical, trachea midline and thyroid not enlarged, symmetric, no tenderness/mass/nodules Lungs: clear to auscultation bilaterally Heart: regular rate and rhythm, S1, S2 normal, no murmur, click, rub or gallop Extremities: extremities normal, atraumatic, no cyanosis or edema Pulses: 2+ and symmetric Skin: Skin color, texture,  turgor normal. No rashes or lesions Neurologic: Alert and oriented X 3, normal strength and tone. Normal symmetric reflexes. Normal coordination and gait  EKG not performed today  ASSESSMENT AND PLAN:   CAD (coronary artery disease) History of CAD status post emergency bypass grafting 2 by Dr. Roxy Manns 04/07/10 in the setting of left main dissection. She'll stains her LAD and circumflex. Her EF preoperatively was 30 at 35% which improved by echo to 35-40%. She has been well until a recent episode of chest and back pain which prompted an ER visit at [facility] for rule out MI. Her enzymes are negative and she was discharged home. She's had no recurrent symptoms. Her last Myoview showed scar in the LAD territory.  Hyperlipidemia History of hyperlipidemia on statin therapy with recent lipid profile performed 02/11/17 revealed total cholesterol 156, LDL 74 and HDL of 65.      Lorretta Harp MD FACP,FACC,FAHA, [facility] 07/30/2017 10:12 AM

## 2017-07-30 NOTE — Patient Instructions (Signed)

## 2017-07-30 NOTE — Assessment & Plan Note (Signed)
History of CAD status post emergency bypass grafting 2 by Dr. Roxy Manns 04/07/10 in the setting of left main dissection. She'll stains her LAD and circumflex. Her EF preoperatively was 30 at 35% which improved by echo to 35-40%. She has been well until a recent episode of chest and back pain which prompted an ER visit at [facility] for rule out MI. Her enzymes are negative and she was discharged home. She's had no recurrent symptoms. Her last Myoview showed scar in the LAD territory.

## 2017-11-08 ENCOUNTER — Other Ambulatory Visit (HOSPITAL_COMMUNITY): Payer: Self-pay | Admitting: Internal Medicine

## 2017-11-08 DIAGNOSIS — Z1231 Encounter for screening mammogram for malignant neoplasm of breast: Secondary | ICD-10-CM

## 2017-11-13 ENCOUNTER — Ambulatory Visit (HOSPITAL_COMMUNITY)
Admission: RE | Admit: 2017-11-13 | Discharge: 2017-11-13 | Disposition: A | Discharge status: Home / Self Care | Payer: Medicare Other | Source: Ambulatory Visit | Attending: Internal Medicine | Admitting: Internal Medicine

## 2017-11-13 DIAGNOSIS — Z1231 Encounter for screening mammogram for malignant neoplasm of breast: Secondary | ICD-10-CM | POA: Diagnosis present

## 2018-02-10 ENCOUNTER — Encounter (INDEPENDENT_AMBULATORY_CARE_PROVIDER_SITE_OTHER): Payer: Self-pay | Admitting: Internal Medicine

## 2018-02-10 ENCOUNTER — Encounter (INDEPENDENT_AMBULATORY_CARE_PROVIDER_SITE_OTHER): Payer: Self-pay | Admitting: *Deleted

## 2018-02-10 ENCOUNTER — Telehealth (INDEPENDENT_AMBULATORY_CARE_PROVIDER_SITE_OTHER): Payer: Self-pay | Admitting: *Deleted

## 2018-02-10 ENCOUNTER — Ambulatory Visit (INDEPENDENT_AMBULATORY_CARE_PROVIDER_SITE_OTHER): Payer: Medicare Other | Admitting: Internal Medicine

## 2018-02-10 VITALS — BP 144/80 | HR 60 | Temp 98.7°F | Ht 63.0 in | Wt 150.2 lb

## 2018-02-10 DIAGNOSIS — R195 Other fecal abnormalities: Secondary | ICD-10-CM | POA: Diagnosis not present

## 2018-02-10 MED ORDER — PEG 3350-KCL-NA BICARB-NACL 420 G PO SOLR: 4000.0000 mL | Freq: Once | ORAL | 0 refills | Status: AC

## 2018-02-10 NOTE — Progress Notes (Signed)
   Subjective:    Patient ID: Amy Finley, female    DOB: 12-Aug-1941, 77 y.o.   MRN: [id number]  HPI Referred by Dr. Willey Blade for positive FIT on 01/20/2018. He last colonoscopy was in 2003.  She has not seen any blood. No change in her stools.  Appetite is good. No weight blood. No family hx of colon cancer. Retired from [employer].   Recent Fecal Immunochemical test was post ive.   02/11/2017 H and H 14.8 and 44.5, MCV 87     Her lat colonoscopy was in 2003 (screening) Dr. Gala Romney: Incomplete.  COLON:  Colonic mucosa surveyed from the rectosigmoid junction to 45 cm. At 45 cm, there was acute flexure in the colon. I was unable to overcome that with the adult scope in spite of changing the patients position and external abdominal pressure. I withdrew the scope and obtained the pediatric colonoscope. Likewise, I spent some time trying to negotiate this acute flexure. The colon was noncompliant at this level. I was unable to overcome the flexure to complete the exam. The patient tolerated the procedure well and was reacted in endoscopy.  IMPRESSION:  1. Internal hemorrhoids otherwise normal rectum.  2. Normal left colon to 45 cm. Exam not completed for reasons outlined     above.    01/14/2002 DG colon with Air Hi Den CM: Incomplete colonoscopy SINGLE DIVERTICULUM IN THE MID PORTION OF THE SIGMOID REGION.  OTHERWISE, NORMAL AIR CONTRAST BARIUM ENEMA.   Review of Systems     Objective:   Physical Exam Blood pressure (!) 144/80, pulse 60, temperature 98.7 F (37.1 C), height 5\' 3"  (1.6 m), weight 130 lb 3.2 oz (59.1 kg). Alert and oriented. Skin warm and dry. Oral mucosa is moist.   . Sclera anicteric, conjunctivae is pink. Thyroid not enlarged. No cervical lymphadenopathy. Lungs clear. Heart regular rate and rhythm.  Abdomen is soft. Bowel sounds are positive. No hepatomegaly. No abdominal masses felt. No tenderness.  No edema to lower extremities         Assessment & Plan:    Positive FIT test. Colonic neoplasm needs to be ruled out. The risks of bleeding, perforation and infection were reviewed with patient.

## 2018-02-10 NOTE — Telephone Encounter (Signed)
Patient needs trilyte 

## 2018-02-10 NOTE — Patient Instructions (Signed)
The risks of bleeding, perforation and infection were reviewed with patient.  

## 2018-03-10 ENCOUNTER — Other Ambulatory Visit: Payer: Self-pay

## 2018-03-10 ENCOUNTER — Ambulatory Visit (HOSPITAL_COMMUNITY)
Admission: RE | Admit: 2018-03-10 | Discharge: 2018-03-10 | Disposition: A | Discharge status: Home / Self Care | Payer: Medicare Other | Source: Ambulatory Visit | Attending: Internal Medicine | Admitting: Internal Medicine

## 2018-03-10 ENCOUNTER — Encounter (HOSPITAL_COMMUNITY): Payer: Self-pay | Admitting: *Deleted

## 2018-03-10 ENCOUNTER — Encounter (HOSPITAL_COMMUNITY)
Admission: RE | Disposition: A | Discharge status: Home / Self Care | Payer: Self-pay | Source: Ambulatory Visit | Attending: Internal Medicine

## 2018-03-10 DIAGNOSIS — E785 Hyperlipidemia, unspecified: Secondary | ICD-10-CM | POA: Insufficient documentation

## 2018-03-10 DIAGNOSIS — R195 Other fecal abnormalities: Secondary | ICD-10-CM | POA: Insufficient documentation

## 2018-03-10 DIAGNOSIS — Z8543 Personal history of malignant neoplasm of ovary: Secondary | ICD-10-CM | POA: Diagnosis not present

## 2018-03-10 DIAGNOSIS — Z87891 Personal history of nicotine dependence: Secondary | ICD-10-CM | POA: Diagnosis not present

## 2018-03-10 DIAGNOSIS — I2510 Atherosclerotic heart disease of native coronary artery without angina pectoris: Secondary | ICD-10-CM | POA: Insufficient documentation

## 2018-03-10 DIAGNOSIS — Z923 Personal history of irradiation: Secondary | ICD-10-CM | POA: Insufficient documentation

## 2018-03-10 DIAGNOSIS — Z79899 Other long term (current) drug therapy: Secondary | ICD-10-CM | POA: Insufficient documentation

## 2018-03-10 DIAGNOSIS — I252 Old myocardial infarction: Secondary | ICD-10-CM | POA: Diagnosis not present

## 2018-03-10 DIAGNOSIS — K6289 Other specified diseases of anus and rectum: Secondary | ICD-10-CM | POA: Diagnosis not present

## 2018-03-10 DIAGNOSIS — Z951 Presence of aortocoronary bypass graft: Secondary | ICD-10-CM | POA: Diagnosis not present

## 2018-03-10 DIAGNOSIS — Z881 Allergy status to other antibiotic agents status: Secondary | ICD-10-CM | POA: Insufficient documentation

## 2018-03-10 DIAGNOSIS — K5730 Diverticulosis of large intestine without perforation or abscess without bleeding: Secondary | ICD-10-CM | POA: Insufficient documentation

## 2018-03-10 DIAGNOSIS — D122 Benign neoplasm of ascending colon: Secondary | ICD-10-CM | POA: Insufficient documentation

## 2018-03-10 DIAGNOSIS — Z8541 Personal history of malignant neoplasm of cervix uteri: Secondary | ICD-10-CM | POA: Insufficient documentation

## 2018-03-10 DIAGNOSIS — K644 Residual hemorrhoidal skin tags: Secondary | ICD-10-CM | POA: Insufficient documentation

## 2018-03-10 HISTORY — PX: POLYPECTOMY: SHX5525

## 2018-03-10 HISTORY — PX: COLONOSCOPY: SHX5424

## 2018-03-10 SURGERY — COLONOSCOPY
Anesthesia: Moderate Sedation

## 2018-03-10 MED ORDER — MIDAZOLAM HCL 5 MG/5ML IJ SOLN
INTRAMUSCULAR | Status: AC
  Filled 2018-03-10: qty 10

## 2018-03-10 MED ORDER — STERILE WATER FOR IRRIGATION IR SOLN
Status: DC | PRN
  Administered 2018-03-10: 14:00:00

## 2018-03-10 MED ORDER — MEPERIDINE HCL 50 MG/ML IJ SOLN
INTRAMUSCULAR | Status: AC
  Filled 2018-03-10: qty 1

## 2018-03-10 MED ORDER — MIDAZOLAM HCL 5 MG/5ML IJ SOLN
INTRAMUSCULAR | Status: DC | PRN
  Administered 2018-03-10: 1 mg via INTRAVENOUS
  Administered 2018-03-10 (×2): 2 mg via INTRAVENOUS

## 2018-03-10 MED ORDER — MEPERIDINE HCL 50 MG/ML IJ SOLN
INTRAMUSCULAR | Status: DC | PRN
  Administered 2018-03-10 (×2): 25 mg via INTRAVENOUS

## 2018-03-10 MED ORDER — SODIUM CHLORIDE 0.9 % IV SOLN
INTRAVENOUS | Status: DC
  Administered 2018-03-10: 13:00:00 via INTRAVENOUS

## 2018-03-10 NOTE — H&P (Addendum)
Amy Finley is an 77 y.o. female.   Chief Complaint: Patient is here for colonoscopy. HPI: Patient is 77 year old Caucasian female who recently had FIT and was positive.  She denies melena or rectal bleeding or change in her bowel habits.  She has remote history of radiation therapy for gynecologic malignancy and has had thin caliber stools ever since.  She has good appetite and her weight has been stable.  She is supposed to be aspirin but has not taken it in the last 3 months. Last colonoscopy was in 2003 by Dr. Gala Romney.  It was incomplete and was followed by barium enema. Family history is negative for CRC.  Past Medical History:  Diagnosis Date  . Cervical cancer ([place])    remote history at age 19  . Coronary artery disease    Myocardial infarction ST segment elevation treated with PCI and stenting of her apical LAD complicated by left main dissection requiring emergency CABG x 2 04/07/10  . Hyperlipidemia, mild    statin intolerant  . Ovarian cancer [facility])     Past Surgical History:  Procedure Laterality Date  . CARDIAC CATHETERIZATION  04/07/10     1.5 x 12 Flex balloon angioplasty.  angioplasty performed in the entire distal third ever restoring apical flow.  2.0 x 18 mini Vision stent deployed.  Dissection in proximal LAD extending back into left main and cirumflex.  Marland Kitchen CATARACT EXTRACTION W/PHACO Left 03/29/2015   Procedure: CATARACT EXTRACTION PHACO AND INTRAOCULAR LENS PLACEMENT;;  Surgeon: Williams Che, MD;  Location: [facility];  Service: Ophthalmology;  Laterality: Left;  CDE 7.86  . CATARACT EXTRACTION W/PHACO Right 08/01/2015   Procedure: CATARACT EXTRACTION PHACO AND INTRAOCULAR LENS PLACEMENT (IOC);  Surgeon: Williams Che, MD;  Location: [facility];  Service: Ophthalmology;  Laterality: Right;  CDE: 7.96  . CORONARY ARTERY BYPASS GRAFT  04/07/10   (emergency)  x2 using saphenous vein graft to mid left anterior descending coronary artery, saphenous vein graft to obtuse marginal  branch and left circumflex coronary artery   . ORIF WRIST FRACTURE Left 2011  . TOTAL ABDOMINAL HYSTERECTOMY  1976    Family History  Problem Relation Age of Onset  . Cancer Mother   . Diabetes Mother   . Congestive Heart Failure Father    Social History:  reports that she quit smoking about 7 years ago. Her smoking use included cigarettes. She has a 75.00 pack-year smoking history. She has never used smokeless tobacco. She reports that she does not drink alcohol or use drugs.  Allergies:  Allergies  Allergen Reactions  . Erythromycin Other (See Comments)    Severe abdominal pain.    Medications Prior to Admission  Medication Sig Dispense Refill  . cromolyn (NASALCROM) 5.2 MG/ACT nasal spray Place 1 spray into both nostrils 2 (two) times daily.     . DHA-EPA-Flaxseed Oil-Vitamin E (THERA TEARS NUTRITION PO) Place 1 drop into both eyes daily.      No results found for this or any previous visit (from the past 48 hour(s)). No results found.  ROS  Blood pressure 133/87, pulse 79, temperature 98.4 F (36.9 C), temperature source Oral, resp. rate (!) 21, SpO2 100 %. Physical Exam  Constitutional: She appears well-developed and well-nourished.  HENT:  Mouth/Throat: Oropharynx is clear and moist.  Eyes: Conjunctivae are normal. No scleral icterus.  Neck: No thyromegaly present.  Cardiovascular: Normal rate, regular rhythm and normal heart sounds.  No murmur heard. Respiratory: Effort normal and breath sounds  normal.  Midsternal scar.  GI:  Abdomen is symmetrical with lower midline scar.  It is soft and nontender with organomegaly or masses.  Musculoskeletal: She exhibits no edema.  Neurological: She is alert.  Skin: Skin is warm and dry.     Assessment/Plan Positive FIT for blood. Diagnostic colonoscopy.  Hildred Laser, MD 03/10/2018, 2:09 PM

## 2018-03-10 NOTE — Op Note (Signed)
[facility] Patient Name: Amy Finley Procedure Date: 03/10/2018 2:03 PM MRN: [id number] Date of Birth: 05/13/1941 Attending MD: Hildred Laser , MD CSN: [id number] Age: 77 Admit Type: Outpatient Procedure:                Colonoscopy Indications:              Positive fecal immunochemical test Providers:                Hildred Laser, MD, Otis Peak B. Sharon Seller, RN, Nelma Rothman, Technician Referring MD:             [name], MD Medicines:                Meperidine 50 mg IV, Midazolam 5 mg IV Complications:            No immediate complications. Estimated Blood Loss:     Estimated blood loss was minimal. Procedure:                Pre-Anesthesia Assessment:                           - Prior to the procedure, a History and Physical                            was performed, and patient medications and                            allergies were reviewed. The patient's tolerance of                            previous anesthesia was also reviewed. The risks                            and benefits of the procedure and the sedation                            options and risks were discussed with the patient.                            All questions were answered, and informed consent                            was obtained. Prior Anticoagulants: The patient has                            taken no previous anticoagulant or antiplatelet                            agents. ASA Grade Assessment: II - A patient with                            mild systemic disease. After reviewing the risks  and benefits, the patient was deemed in                            satisfactory condition to undergo the procedure.                           After obtaining informed consent, the colonoscope                            was passed under direct vision. Throughout the                            procedure, the patient's blood pressure, pulse, and           oxygen saturations were monitored continuously. The                            [id number] ([id number]) scope was introduced through                            the anus and advanced to the the cecum, identified                            by appendiceal orifice and ileocecal valve. The                            colonoscopy was technically difficult and complex                            due to restricted mobility of the colon. Successful                            completion of the procedure was aided by changing                            the patient's position and using manual pressure.                            The patient tolerated the procedure well. The                            quality of the bowel preparation was good. The                            ileocecal valve, appendiceal orifice, and rectum                            were photographed. Scope In: 2:18:01 PM Scope Out: 2:52:44 PM Scope Withdrawal Time: 0 hours 14 minutes 0 seconds  Total Procedure Duration: 0 hours 34 minutes 43 seconds  Findings:      The perianal and digital rectal examinations were normal.      A small polyp was found in the ascending colon. The polyp was sessile.       Biopsies were taken with a cold forceps  for histology.      A single medium-mouthed diverticulum was found in the hepatic flexure.      Scattered medium-mouthed diverticula were found in the sigmoid colon.      External hemorrhoids were found during retroflexion. The hemorrhoids       were small.      Anal papilla(e) were hypertrophied. Impression:               - One small polyp in the ascending colon. Biopsied.                           - Diverticulosis at the hepatic flexure.                           - Diverticulosis in the sigmoid colon.                           - External hemorrhoids.                           - Anal papilla(e) were hypertrophied. Moderate Sedation:      Moderate (conscious) sedation was administered by the  endoscopy nurse       and supervised by the endoscopist. The following parameters were       monitored: oxygen saturation, heart rate, blood pressure, CO2       capnography and response to care. Total physician intraservice time was       39 minutes. Recommendation:           - Patient has a contact number available for                            emergencies. The signs and symptoms of potential                            delayed complications were discussed with the                            patient. Return to normal activities tomorrow.                            Written discharge instructions were provided to the                            patient.                           - High fiber diet today.                           - Continue present medications.                           - No aspirin, ibuprofen, naproxen, or other                            non-steroidal anti-inflammatory drugs for 1 day.                           -  Await pathology results.                           - Repeat colonoscopy is recommended. The                            colonoscopy date will be determined after pathology                            results from today's exam become available for                            review. Procedure Code(s):        --- Professional ---                           [phone], Colonoscopy, flexible; with biopsy, single                            or multiple                           G0500, Moderate sedation services provided by the                            same physician or other qualified health care                            professional performing a gastrointestinal                            endoscopic service that sedation supports,                            requiring the presence of an independent trained                            observer to assist in the monitoring of the                            patient's level of consciousness and physiological                             status; initial 15 minutes of intra-service time;                            patient age 11 years or older (additional time may                            be reported with [phone], as appropriate)                           [phone], Moderate sedation services provided by the  same physician or other qualified health care                            professional performing the diagnostic or                            therapeutic service that the sedation supports,                            requiring the presence of an independent trained                            observer to assist in the monitoring of the                            patient's level of consciousness and physiological                            status; each additional 15 minutes intraservice                            time (List separately in addition to code for                            primary service)                           [phone], Moderate sedation services provided by the                            same physician or other qualified health care                            professional performing the diagnostic or                            therapeutic service that the sedation supports,                            requiring the presence of an independent trained                            observer to assist in the monitoring of the                            patient's level of consciousness and physiological                            status; each additional 15 minutes intraservice                            time (List separately in addition to code for  primary service) Diagnosis Code(s):        --- Professional ---                           D12.2, Benign neoplasm of ascending colon                           K64.4, Residual hemorrhoidal skin tags                           K62.89, Other specified diseases of anus and rectum                           R19.5, Other fecal  abnormalities                           K57.30, Diverticulosis of large intestine without                            perforation or abscess without bleeding CPT copyright 2017 [employer]. All rights reserved. The codes documented in this report are preliminary and upon coder review may  be revised to meet current compliance requirements. Hildred Laser, MD Hildred Laser, MD 03/10/2018 3:02:30 PM This report has been signed electronically. Number of Addenda: 0

## 2018-03-10 NOTE — Discharge Instructions (Signed)
No aspirin or NSAIDs for 24 hours. Resume usual medications as before. High-fiber diet. No driving for 24 hours. Physician will call with biopsy results.   Colonoscopy, Adult, Care After This sheet gives you information about how to care for yourself after your procedure. Your doctor may also give you more specific instructions. If you have problems or questions, call your doctor. Follow these instructions at home: General instructions   For the first 24 hours after the procedure: ? Do not drive or use machinery. ? Do not sign important documents. ? Do not drink alcohol. ? Do your daily activities more slowly than normal. ? Eat foods that are soft and easy to digest. ? Rest often.  Take over-the-counter or prescription medicines only as told by your doctor.  It is up to you to get the results of your procedure. Ask your doctor, or the department performing the procedure, when your results will be ready. To help cramping and bloating:  Try walking around.  Put heat on your belly (abdomen) as told by your doctor. Use a heat source that your doctor recommends, such as a moist heat pack or a heating pad. ? Put a towel between your skin and the heat source. ? Leave the heat on for 20-30 minutes. ? Remove the heat if your skin turns bright red. This is especially important if you cannot feel pain, heat, or cold. You can get burned. Eating and drinking  Drink enough fluid to keep your pee (urine) clear or pale yellow.  Return to your normal diet as told by your doctor. Avoid heavy or fried foods that are hard to digest.  Avoid drinking alcohol for as long as told by your doctor. Contact a doctor if:  You have blood in your poop (stool) 2-3 days after the procedure. Get help right away if:  You have more than a small amount of blood in your poop.  You see large clumps of tissue (blood clots) in your poop.  Your belly is swollen.  You feel sick to your stomach  (nauseous).  You throw up (vomit).  You have a fever.  You have belly pain that gets worse, and medicine does not help your pain. This information is not intended to replace advice given to you by your health care provider. Make sure you discuss any questions you have with your health care provider.  PATIENT INSTRUCTIONS POST-ANESTHESIA  IMMEDIATELY FOLLOWING SURGERY:  Do not drive or operate machinery for the first twenty four hours after surgery.  Do not make any important decisions for twenty four hours after surgery or while taking narcotic pain medications or sedatives.  If you develop intractable nausea and vomiting or a severe headache please notify your doctor immediately.  FOLLOW-UP:  Please make an appointment with your surgeon as instructed. You do not need to follow up with anesthesia unless specifically instructed to do so.  WOUND CARE INSTRUCTIONS (if applicable):  Keep a dry clean dressing on the anesthesia/puncture wound site if there is drainage.  Once the wound has quit draining you may leave it open to air.  Generally you should leave the bandage intact for twenty four hours unless there is drainage.  If the epidural site drains for more than 36-48 hours please call the anesthesia department.  QUESTIONS?:  Please feel free to call your physician or the hospital operator if you have any questions, and they will be happy to assist you.       Colon Polyps Polyps are  tissue growths inside the body. Polyps can grow in many places, including the large intestine (colon). A polyp may be a round bump or a mushroom-shaped growth. You could have one polyp or several. Most colon polyps are noncancerous (benign). However, some colon polyps can become cancerous over time. What are the causes? The exact cause of colon polyps is not known. What increases the risk? This condition is more likely to develop in people who:  Have a family history of colon cancer or colon polyps.  Are  older than 37 or older than 45 if they are African American.  Have inflammatory bowel disease, such as ulcerative colitis or Crohn disease.  Are overweight.  Smoke cigarettes.  Do not get enough exercise.  Drink too much alcohol.  Eat a diet that is: ? High in fat and red meat. ? Low in fiber.  Had childhood cancer that was treated with abdominal radiation.  What are the signs or symptoms? Most polyps do not cause symptoms. If you have symptoms, they may include:  Blood coming from your rectum when having a bowel movement.  Blood in your stool.The stool may look dark red or black.  A change in bowel habits, such as constipation or diarrhea.  How is this diagnosed? This condition is diagnosed with a colonoscopy. This is a procedure that uses a lighted, flexible scope to look at the inside of your colon. How is this treated? Treatment for this condition involves removing any polyps that are found. Those polyps will then be tested for cancer. If cancer is found, your health care provider will talk to you about options for colon cancer treatment. Follow these instructions at home: Diet  Eat plenty of fiber, such as fruits, vegetables, and whole grains.  Eat foods that are high in calcium and vitamin D, such as milk, cheese, yogurt, eggs, liver, fish, and broccoli.  Limit foods high in fat, red meats, and processed meats, such as hot dogs, sausage, bacon, and lunch meats.  Maintain a healthy weight, or lose weight if recommended by your health care provider. General instructions  Do not smoke cigarettes.  Do not drink alcohol excessively.  Keep all follow-up visits as told by your health care provider. This is important. This includes keeping regularly scheduled colonoscopies. Talk to your health care provider about when you need a colonoscopy.  Exercise every day or as told by your health care provider. Contact a health care provider if:  You have new or worsening  bleeding during a bowel movement.  You have new or increased blood in your stool.  You have a change in bowel habits.  You unexpectedly lose weight. This information is not intended to replace advice given to you by your health care provider. Make sure you discuss any questions you have with your health care provider.   Diverticulosis Diverticulosis is a condition that develops when small pouches (diverticula) form in the wall of the large intestine (colon). The colon is where water is absorbed and stool is formed. The pouches form when the inside layer of the colon pushes through weak spots in the outer layers of the colon. You may have a few pouches or many of them. What are the causes? The cause of this condition is not known. What increases the risk? The following factors may make you more likely to develop this condition:  Being older than age 6. Your risk for this condition increases with age. Diverticulosis is rare among people younger than age 56.  By age 68, many people have it.  Eating a low-fiber diet.  Having frequent constipation.  Being overweight.  Not getting enough exercise.  Smoking.  Taking over-the-counter pain medicines, like aspirin and ibuprofen.  Having a family history of diverticulosis.  What are the signs or symptoms? In most people, there are no symptoms of this condition. If you do have symptoms, they may include:  Bloating.  Cramps in the abdomen.  Constipation or diarrhea.  Pain in the lower left side of the abdomen.  How is this diagnosed? This condition is most often diagnosed during an exam for other colon problems. Because diverticulosis usually has no symptoms, it often cannot be diagnosed independently. This condition may be diagnosed by:  Using a flexible scope to examine the colon (colonoscopy).  Taking an X-ray of the colon after dye has been put into the colon (barium enema).  Doing a CT scan.  How is this treated? You may  not need treatment for this condition if you have never developed an infection related to diverticulosis. If you have had an infection before, treatment may include:  Eating a high-fiber diet. This may include eating more fruits, vegetables, and grains.  Taking a fiber supplement.  Taking a live bacteria supplement (probiotic).  Taking medicine to relax your colon.  Taking antibiotic medicines.  Follow these instructions at home:  Drink 6-8 glasses of water or more each day to prevent constipation.  Try not to strain when you have a bowel movement.  If you have had an infection before: ? Eat more fiber as directed by your health care provider or your diet and nutrition specialist (dietitian). ? Take a fiber supplement or probiotic, if your health care provider approves.  Take over-the-counter and prescription medicines only as told by your health care provider.  If you were prescribed an antibiotic, take it as told by your health care provider. Do not stop taking the antibiotic even if you start to feel better.  Keep all follow-up visits as told by your health care provider. This is important. Contact a health care provider if:  You have pain in your abdomen.  You have bloating.  You have cramps.  You have not had a bowel movement in 3 days. Get help right away if:  Your pain gets worse.  Your bloating becomes very bad.  You have a fever or chills, and your symptoms suddenly get worse.  You vomit.  You have bowel movements that are bloody or black.  You have bleeding from your rectum. Summary  Diverticulosis is a condition that develops when small pouches (diverticula) form in the wall of the large intestine (colon).  You may have a few pouches or many of them.  This condition is most often diagnosed during an exam for other colon problems.  If you have had an infection related to diverticulosis, treatment may include increasing the fiber in your diet, taking  supplements, or taking medicines. This information is not intended to replace advice given to you by your health care provider. Make sure you discuss any questions you have with your health care provider. Document Released: 06/07/2004 Document Revised: 07/30/2016 Document Reviewed: 07/30/2016 Elsevier Interactive Patient Education  2017 [employer].

## 2018-03-17 ENCOUNTER — Encounter (HOSPITAL_COMMUNITY): Payer: Self-pay | Admitting: Internal Medicine

## 2018-03-25 ENCOUNTER — Other Ambulatory Visit (HOSPITAL_COMMUNITY): Payer: Self-pay | Admitting: Internal Medicine

## 2018-03-25 DIAGNOSIS — Z780 Asymptomatic menopausal state: Secondary | ICD-10-CM

## 2018-04-03 ENCOUNTER — Ambulatory Visit (HOSPITAL_COMMUNITY)
Admission: RE | Admit: 2018-04-03 | Discharge: 2018-04-03 | Disposition: A | Discharge status: Home / Self Care | Payer: Medicare Other | Source: Ambulatory Visit | Attending: Internal Medicine | Admitting: Internal Medicine

## 2018-04-03 DIAGNOSIS — Z780 Asymptomatic menopausal state: Secondary | ICD-10-CM | POA: Diagnosis present

## 2018-04-03 DIAGNOSIS — M8589 Other specified disorders of bone density and structure, multiple sites: Secondary | ICD-10-CM | POA: Insufficient documentation

## 2018-11-11 ENCOUNTER — Other Ambulatory Visit (HOSPITAL_COMMUNITY): Payer: Self-pay | Admitting: Internal Medicine

## 2018-11-11 DIAGNOSIS — Z1231 Encounter for screening mammogram for malignant neoplasm of breast: Secondary | ICD-10-CM

## 2018-11-14 ENCOUNTER — Ambulatory Visit (HOSPITAL_COMMUNITY)
Admission: RE | Admit: 2018-11-14 | Discharge: 2018-11-14 | Disposition: A | Discharge status: Home / Self Care | Payer: Medicare Other | Source: Ambulatory Visit | Attending: Internal Medicine | Admitting: Internal Medicine

## 2018-11-14 DIAGNOSIS — Z1231 Encounter for screening mammogram for malignant neoplasm of breast: Secondary | ICD-10-CM | POA: Diagnosis not present

## 2019-12-15 ENCOUNTER — Other Ambulatory Visit (HOSPITAL_COMMUNITY): Payer: Self-pay | Admitting: Internal Medicine

## 2019-12-15 DIAGNOSIS — Z1231 Encounter for screening mammogram for malignant neoplasm of breast: Secondary | ICD-10-CM

## 2019-12-21 ENCOUNTER — Other Ambulatory Visit: Payer: Self-pay

## 2019-12-21 ENCOUNTER — Ambulatory Visit (HOSPITAL_COMMUNITY)
Admission: RE | Admit: 2019-12-21 | Discharge: 2019-12-21 | Disposition: A | Discharge status: Home / Self Care | Payer: Medicare Other | Source: Ambulatory Visit | Attending: Internal Medicine | Admitting: Internal Medicine

## 2019-12-21 DIAGNOSIS — Z1231 Encounter for screening mammogram for malignant neoplasm of breast: Secondary | ICD-10-CM

## 2020-04-02 ENCOUNTER — Ambulatory Visit
Admission: EM | Admit: 2020-04-02 | Discharge: 2020-04-02 | Disposition: A | Discharge status: Home / Self Care | Payer: Medicare Other

## 2020-04-02 ENCOUNTER — Encounter: Payer: Self-pay | Admitting: Emergency Medicine

## 2020-04-02 ENCOUNTER — Other Ambulatory Visit: Payer: Self-pay

## 2020-04-02 DIAGNOSIS — J0100 Acute maxillary sinusitis, unspecified: Secondary | ICD-10-CM | POA: Diagnosis not present

## 2020-04-02 MED ORDER — AMOXICILLIN-POT CLAVULANATE 875-125 MG PO TABS: 1.0000 | ORAL_TABLET | Freq: Two times a day (BID) | ORAL | 0 refills | Status: AC

## 2020-04-02 NOTE — Discharge Instructions (Addendum)
Rest and push fluids Augmentin prescribed.  Take as directed and to completion Continue with OTC ibuprofen/tylenol as needed for pain Follow up with PCP Return or go to the ED if you have any new or worsening symptoms such as fever, chills, worsening sinus pain/pressure, cough, sore throat, chest pain, shortness of breath, abdominal pain, changes in bowel or bladder habits, etc..Marland Kitchen

## 2020-04-02 NOTE — ED Triage Notes (Signed)
Scratchy sore throat, with post nasal drainage.

## 2020-04-02 NOTE — ED Triage Notes (Signed)
Nasal congestion and right sides rib pain since Tuesday.  COVID test was neg on Friday. mucinex and vitamins she has been using.

## 2020-04-02 NOTE — ED Provider Notes (Signed)
[facility]   [id number] 04/02/20 Arrival Time: 7322   CC: URI  SUBJECTIVE: History from: patient.  SAUMYA HUKILL is a 79 y.o. female who presents with sinus pain, pressure, congestion, runny nose, and cough x 5 days.  Denies sick exposure to COVID, flu or strep.  Had negative COVID PCR test.  Has tried OTC medications without relief.  Denies aggravating factors.  Reports previous symptoms in the past.   Denies fever, chills, SOB, wheezing, chest pain, nausea, changes in bowel or bladder habits.    ROS: As per HPI.  All other pertinent ROS negative.     Past Medical History:  Diagnosis Date  . Cervical cancer ([place])    remote history at age 37  . Coronary artery disease    Myocardial infarction ST segment elevation treated with PCI and stenting of her apical LAD complicated by left main dissection requiring emergency CABG x 2 04/07/10  . Hyperlipidemia, mild    statin intolerant  . Ovarian cancer [facility])    Past Surgical History:  Procedure Laterality Date  . CARDIAC CATHETERIZATION  04/07/10     1.5 x 12 Flex balloon angioplasty.  angioplasty performed in the entire distal third ever restoring apical flow.  2.0 x 18 mini Vision stent deployed.  Dissection in proximal LAD extending back into left main and cirumflex.  Marland Kitchen CATARACT EXTRACTION W/PHACO Left 03/29/2015   Procedure: CATARACT EXTRACTION PHACO AND INTRAOCULAR LENS PLACEMENT;;  Surgeon: Williams Che, MD;  Location: [facility] ORS;  Service: Ophthalmology;  Laterality: Left;  CDE 7.86  . CATARACT EXTRACTION W/PHACO Right 08/01/2015   Procedure: CATARACT EXTRACTION PHACO AND INTRAOCULAR LENS PLACEMENT (IOC);  Surgeon: Williams Che, MD;  Location: [facility] ORS;  Service: Ophthalmology;  Laterality: Right;  CDE: 7.96  . COLONOSCOPY N/A 03/10/2018   Procedure: COLONOSCOPY;  Surgeon: Rogene Houston, MD;  Location: [facility] ENDO SUITE;  Service: Endoscopy;  Laterality: N/A;  1:25  . CORONARY ARTERY BYPASS GRAFT  04/07/10   (emergency)   x2 using saphenous vein graft to mid left anterior descending coronary artery, saphenous vein graft to obtuse marginal branch and left circumflex coronary artery   . ORIF WRIST FRACTURE Left 2011  . POLYPECTOMY  03/10/2018   Procedure: POLYPECTOMY;  Surgeon: Rogene Houston, MD;  Location: [facility] END[facility] SUITE;  Service: Endoscopy;;  colon   . TOTAL ABDOMINAL HYSTERECTOMY  1976   Allergies  Allergen Reactions  . Erythromycin Other (See Comments)    Severe abdominal pain.   No current facility-administered medications on file prior to encounter.   Current Outpatient Medications on File Prior to Encounter  Medication Sig Dispense Refill  . Ascorbic Acid (VITAMIN C) 100 MG tablet Take 100 mg by mouth daily.    Marland Kitchen b complex vitamins tablet Take 1 tablet by mouth daily.    . calcium carbonate (OS-CAL - DOSED IN MG OF ELEMENTAL CALCIUM) 1250 (500 Ca) MG tablet Take 1 tablet by mouth.    . cholecalciferol (VITAMIN D3) 25 MCG (1000 UNIT) tablet Take 1,000 Units by mouth daily.    . DHA-EPA-Flaxseed Oil-Vitamin E (THERA TEARS NUTRITION PO) Place 1 drop into both eyes daily.    . [DISCONTINUED] cromolyn (NASALCROM) 5.2 MG/ACT nasal spray Place 1 spray into both nostrils 2 (two) times daily.      Social History   Socioeconomic History  . Marital status: Divorced    Spouse name: Not on file  . Number of children: 2  . Years of education:  Not on file  . Highest education level: Not on file  Occupational History  . Not on file  Tobacco Use  . Smoking status: Former Smoker    Packs/day: 1.50    Years: 50.00    Pack years: 75.00    Types: Cigarettes    Quit date: 04/07/2010    Years since quitting: 9.9  . Smokeless tobacco: Never Used  Vaping Use  . Vaping Use: Never used  Substance and Sexual Activity  . Alcohol use: No  . Drug use: No  . Sexual activity: Not on file  Other Topics Concern  . Not on file  Social History Narrative  . Not on file   Social Determinants of Health    Financial Resource Strain:   . Difficulty of Paying Living Expenses:   Food Insecurity:   . Worried About Charity fundraiser in the Last Year:   . Arboriculturist in the Last Year:   Transportation Needs:   . Film/video editor (Medical):   Marland Kitchen Lack of Transportation (Non-Medical):   Physical Activity:   . Days of Exercise per Week:   . Minutes of Exercise per Session:   Stress:   . Feeling of Stress :   Social Connections:   . Frequency of Communication with Friends and Family:   . Frequency of Social Gatherings with Friends and Family:   . Attends Religious Services:   . Active Member of Clubs or Organizations:   . Attends Archivist Meetings:   Marland Kitchen Marital Status:   Intimate Partner Violence:   . Fear of Current or Ex-Partner:   . Emotionally Abused:   Marland Kitchen Physically Abused:   . Sexually Abused:    Family History  Problem Relation Age of Onset  . Cancer Mother   . Diabetes Mother   . Congestive Heart Failure Father     OBJECTIVE:  Vitals:   04/02/20 1223 04/02/20 1224  BP: 138/70   Pulse: (!) 102   Resp: 20   Temp: 98.6 F (37 C)   TempSrc: Oral   SpO2: 92%   Weight:  152 lb (68.9 kg)     General appearance: alert; appears fatigued, but nontoxic; speaking in full sentences and tolerating own secretions HEENT: NCAT; Ears: EACs clear, TMs pearly gray; Eyes: PERRL.  EOM grossly intact. Sinuses: TTP over maxillary sinuses; Nose: nares patent without rhinorrhea, Throat: oropharynx clear, tonsils non erythematous or enlarged, uvula midline  Neck: supple without LAD Lungs: unlabored respirations, symmetrical air entry; cough: absent; no respiratory distress; CTAB Heart: regular rate and rhythm.   Skin: warm and dry Psychological: alert and cooperative; normal mood and affect  ASSESSMENT & PLAN:  1. Acute non-recurrent maxillary sinusitis     Meds ordered this encounter  Medications  . amoxicillin-clavulanate (AUGMENTIN) 875-125 MG tablet    Sig:  Take 1 tablet by mouth every 12 (twelve) hours for 10 days.    Dispense:  20 tablet    Refill:  0    Order Specific Question:   Supervising Provider    Answer:   Raylene Everts [[id number]]    Rest and push fluids Augmentin prescribed.  Take as directed and to completion Continue with OTC ibuprofen/tylenol as needed for pain Follow up with PCP Return or go to the ED if you have any new or worsening symptoms such as fever, chills, worsening sinus pain/pressure, cough, sore throat, chest pain, shortness of breath, abdominal pain, changes in bowel or bladder habits,  etc...   Reviewed expectations re: course of current medical issues. Questions answered. Outlined signs and symptoms indicating need for more acute intervention. Patient verbalized understanding. After Visit Summary given.         Lestine Box, PA-C 04/02/20 1302

## 2020-05-27 ENCOUNTER — Other Ambulatory Visit (HOSPITAL_COMMUNITY): Payer: Self-pay | Admitting: Internal Medicine

## 2020-05-27 ENCOUNTER — Other Ambulatory Visit: Payer: Self-pay | Admitting: Internal Medicine

## 2020-05-27 DIAGNOSIS — Z87891 Personal history of nicotine dependence: Secondary | ICD-10-CM

## 2020-06-21 ENCOUNTER — Other Ambulatory Visit: Payer: Self-pay

## 2020-06-21 ENCOUNTER — Ambulatory Visit (HOSPITAL_COMMUNITY)
Admission: RE | Admit: 2020-06-21 | Discharge: 2020-06-21 | Disposition: A | Discharge status: Home / Self Care | Payer: Medicare Other | Source: Ambulatory Visit | Attending: Internal Medicine | Admitting: Internal Medicine

## 2020-06-21 DIAGNOSIS — Z87891 Personal history of nicotine dependence: Secondary | ICD-10-CM | POA: Insufficient documentation

## 2020-07-01 ENCOUNTER — Other Ambulatory Visit: Payer: Self-pay

## 2020-07-01 ENCOUNTER — Encounter: Payer: Self-pay | Admitting: Cardiovascular Disease

## 2020-07-01 ENCOUNTER — Ambulatory Visit: Payer: Medicare Other | Admitting: Cardiovascular Disease

## 2020-07-01 VITALS — BP 147/75 | HR 85 | Ht 63.0 in | Wt 155.0 lb

## 2020-07-01 DIAGNOSIS — E782 Mixed hyperlipidemia: Secondary | ICD-10-CM | POA: Diagnosis not present

## 2020-07-01 DIAGNOSIS — I2510 Atherosclerotic heart disease of native coronary artery without angina pectoris: Secondary | ICD-10-CM | POA: Diagnosis not present

## 2020-07-01 NOTE — Assessment & Plan Note (Signed)
History of hyperlipidemia with lipid profile performed 04/17/2019 revealing total cluster 142, LDL of 59 and HDL 68.  Her most recent lipid profile performed 05/21/2020 revealed a total cholesterol of 163 with an LDL of 81, slightly higher probably related to dietary indiscretion.  She is not on statin therapy because of statin intolerance.

## 2020-07-01 NOTE — Progress Notes (Signed)
07/01/2020 Amy Finley   04/18/1941  [id number]  Primary Physician [name], MD Primary Cardiologist: Lorretta Harp MD FACP, [place], [place], [place]  HPI:  Amy Finley is a 79 y.o. thin-appearing, married Caucasian female, mother of 2 children who I last sawher in the office  07/30/2017.Marland Kitchen She has a history of ST-segment-elevation myocardial infarction treated with PCI and stenting of her apical LAD which is complicated by a left main dissection requiring emergency coronary artery bypass grafting x2 by Dr. Darylene Price April 07, 2010. She had a vein to her LAD and circumflex. 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 Myoview showed scar in the LAD territory. She has been completely asymptomatic. Her most recent lab work performedby her PCP 02/11/17 revealed total cholesterol 156, LDL 74 and HDL 65. She was recently seen in the [facility] ER overnight for atypical chest pain 05/31/17. She does admit to being under a lot of stress at home and had eaten collar greens a day before. Her enzymes were negative. EKG showed no acute change and she was discharged home. She's had no recurrent symptoms.  Since I saw her 3 years ago she is remained stable.  She denies chest pain or shortness of breath.   Current Meds  Medication Sig  . Ascorbic Acid (VITAMIN C) 100 MG tablet Take 100 mg by mouth daily.  Marland Kitchen aspirin EC 81 MG tablet Take 81 mg by mouth daily. Swallow whole.  . b complex vitamins tablet Take 1 tablet by mouth daily.  . chlorpheniramine (CHLOR-TRIMETON) 4 MG tablet Take 4 mg by mouth daily.  . cholecalciferol (VITAMIN D3) 25 MCG (1000 UNIT) tablet Take 1,000 Units by mouth daily.  . DHA-EPA-Flaxseed Oil-Vitamin E (THERA TEARS NUTRITION PO) Place 1 drop into both eyes daily.  Marland Kitchen loratadine (CLARITIN) 10 MG tablet Take 10 mg by mouth daily.  Marland Kitchen MAGNESIUM CITRATE PO Take 250 mg by mouth.    . melatonin 5 MG TABS Take 5 mg by mouth.  . Zinc Sulfate (ZINC 15 PO) Take by mouth.     Allergies  Allergen Reactions  . Erythromycin Other (See Comments)    Severe abdominal pain.    Social History   Socioeconomic History  . Marital status: Divorced    Spouse name: Not on file  . Number of children: 2  . Years of education: Not on file  . Highest education level: Not on file  Occupational History  . Not on file  Tobacco Use  . Smoking status: Former Smoker    Packs/day: 1.50    Years: 50.00    Pack years: 75.00    Types: Cigarettes    Quit date: 04/07/2010    Years since quitting: 10.2  . Smokeless tobacco: Never Used  Vaping Use  . Vaping Use: Never used  Substance and Sexual Activity  . Alcohol use: No  . Drug use: No  . Sexual activity: Not on file  Other Topics Concern  . Not on file  Social History Narrative  . Not on file   Social Determinants of Health   Financial Resource Strain:   . Difficulty of Paying Living Expenses: Not on file  Food Insecurity:   . Worried About Charity fundraiser in the Last Year: Not on file  . Ran Out of Food in the Last Year: Not on file  Transportation Needs:   .  Lack of Transportation (Medical): Not on file  . Lack of Transportation (Non-Medical): Not on file  Physical Activity:   . Days of Exercise per Week: Not on file  . Minutes of Exercise per Session: Not on file  Stress:   . Feeling of Stress : Not on file  Social Connections:   . Frequency of Communication with Friends and Family: Not on file  . Frequency of Social Gatherings with Friends and Family: Not on file  . Attends Religious Services: Not on file  . Active Member of Clubs or Organizations: Not on file  . Attends Archivist Meetings: Not on file  . Marital Status: Not on file  Intimate Partner Violence:   . Fear of Current or Ex-Partner: Not on file  . Emotionally Abused: Not on file  . Physically Abused: Not on file  . Sexually Abused:  Not on file     Review of Systems: General: negative for chills, fever, night sweats or weight changes.  Cardiovascular: negative for chest pain, dyspnea on exertion, edema, orthopnea, palpitations, paroxysmal nocturnal dyspnea or shortness of breath Dermatological: negative for rash Respiratory: negative for cough or wheezing Urologic: negative for hematuria Abdominal: negative for nausea, vomiting, diarrhea, bright red blood per rectum, melena, or hematemesis Neurologic: negative for visual changes, syncope, or dizziness All other systems reviewed and are otherwise negative except as noted above.    Blood pressure (!) 147/75, pulse 85, height 5\' 3"  (1.6 m), weight 155 lb (70.3 kg), SpO2 94 %.  General appearance: alert and no distress Neck: no adenopathy, no carotid bruit, no JVD, supple, symmetrical, trachea midline and thyroid not enlarged, symmetric, no tenderness/mass/nodules Lungs: clear to auscultation bilaterally Heart: regular rate and rhythm, S1, S2 normal, no murmur, click, rub or gallop Extremities: extremities normal, atraumatic, no cyanosis or edema Pulses: 2+ and symmetric Skin: Skin color, texture, turgor normal. No rashes or lesions Neurologic: Alert and oriented X 3, normal strength and tone. Normal symmetric reflexes. Normal coordination and gait  EKG sinus rhythm at 85 with right bundle branch block and anterior Q waves.  This is unchanged from prior EKGs.  Personally reviewed this EKG.  ASSESSMENT AND PLAN:   CAD (coronary artery disease) History of CAD status post ST segment elevation myocardial infarction treated with PCI and stenting of her apical LAD complicated by left main dissection ultimately requiring emergency CABG x2 by Dr. Roxy Manns 04/07/2010.  She had a vein to her LAD and circumflex.  Her EF postoperatively was 30 to 35%.  She is completely asymptomatic.  Hyperlipidemia History of hyperlipidemia with lipid profile performed 04/17/2019 revealing total  cluster 142, LDL of 59 and HDL 68.  Her most recent lipid profile performed 05/21/2020 revealed a total cholesterol of 163 with an LDL of 81, slightly higher probably related to dietary indiscretion.  She is not on statin therapy because of statin intolerance.      Lorretta Harp MD FACP,FACC,FAHA, [facility] 07/01/2020 9:57 AM

## 2020-07-01 NOTE — Assessment & Plan Note (Addendum)
History of CAD status post ST segment elevation myocardial infarction treated with PCI and stenting of her apical LAD complicated by left main dissection ultimately requiring emergency CABG x2 by Dr. Roxy Manns 04/07/2010.  She had a vein to her LAD and circumflex.  Her EF postoperatively was 30 to 35%.  She is completely asymptomatic.

## 2020-07-01 NOTE — Patient Instructions (Signed)

## 2020-07-29 ENCOUNTER — Ambulatory Visit (HOSPITAL_COMMUNITY)
Admission: RE | Admit: 2020-07-29 | Discharge: 2020-07-29 | Disposition: A | Discharge status: Home / Self Care | Payer: Medicare Other | Source: Ambulatory Visit | Attending: Internal Medicine | Admitting: Internal Medicine

## 2020-07-29 ENCOUNTER — Other Ambulatory Visit: Payer: Self-pay

## 2020-07-29 ENCOUNTER — Other Ambulatory Visit: Payer: Self-pay | Admitting: Internal Medicine

## 2020-07-29 ENCOUNTER — Other Ambulatory Visit (HOSPITAL_COMMUNITY): Payer: Self-pay | Admitting: Internal Medicine

## 2020-07-29 DIAGNOSIS — R1011 Right upper quadrant pain: Secondary | ICD-10-CM

## 2020-08-01 ENCOUNTER — Other Ambulatory Visit: Payer: Self-pay | Admitting: Family Medicine

## 2020-08-01 DIAGNOSIS — K8020 Calculus of gallbladder without cholecystitis without obstruction: Secondary | ICD-10-CM

## 2020-08-01 NOTE — Progress Notes (Signed)
ref

## 2020-08-02 ENCOUNTER — Other Ambulatory Visit: Payer: Self-pay

## 2020-08-02 ENCOUNTER — Encounter: Payer: Self-pay | Admitting: General Surgery

## 2020-08-02 ENCOUNTER — Ambulatory Visit: Payer: Medicare Other | Admitting: General Surgery

## 2020-08-02 VITALS — BP 129/71 | HR 89 | Temp 98.3°F | Resp 14 | Ht 63.0 in | Wt 150.0 lb

## 2020-08-02 DIAGNOSIS — K8020 Calculus of gallbladder without cholecystitis without obstruction: Secondary | ICD-10-CM | POA: Diagnosis not present

## 2020-08-02 NOTE — Patient Instructions (Signed)
Cholelithiasis  Cholelithiasis is a form of gallbladder disease in which gallstones form in the gallbladder. The gallbladder is an organ that stores bile. Bile is made in the liver, and it helps to digest fats. Gallstones begin as small crystals and slowly grow into stones. They may cause no symptoms until the gallbladder tightens (contracts) and a gallstone is blocking the duct (gallbladder attack), which can cause pain. Cholelithiasis is also referred to as gallstones. There are two main types of gallstones:  Cholesterol stones. These are made of hardened cholesterol and are usually yellow-green in color. They are the most common type of gallstone. Cholesterol is a white, waxy, fat-like substance that is made in the liver.  Pigment stones. These are dark in color and are made of a red-yellow substance that forms when hemoglobin from red blood cells breaks down (bilirubin). What are the causes? This condition may be caused by an imbalance in the substances that bile is made of. This can happen if the bile:  Has too much bilirubin.  Has too much cholesterol.  Does not have enough bile salts. These salts help the body absorb and digest fats. In some cases, this condition can also be caused by the gallbladder not emptying completely or often enough. What increases the risk? The following factors may make you more likely to develop this condition:  Being female.  Having multiple pregnancies. Health care providers sometimes advise removing diseased gallbladders before future pregnancies.  Eating a diet that is heavy in fried foods, fat, and refined carbohydrates, like white bread and white rice.  Being obese.  Being older than age 77.  Prolonged use of medicines that contain female hormones (estrogen).  Having diabetes mellitus.  Rapidly losing weight.  Having a family history of gallstones.  Being of American Indian or Mexican descent.  Having an intestinal disease such as Crohn  disease.  Having metabolic syndrome.  Having cirrhosis.  Having severe types of anemia such as sickle cell anemia. What are the signs or symptoms? In most cases, there are no symptoms. These are known as silent gallstones. If a gallstone blocks the bile ducts, it can cause a gallbladder attack. The main symptom of a gallbladder attack is sudden pain in the upper right abdomen. The pain usually comes at night or after eating a large meal. The pain can last for one or several hours and can spread to the right shoulder or chest. If the bile duct is blocked for more than a few hours, it can cause infection or inflammation of the gallbladder, liver, or pancreas, which may cause:  Nausea.  Vomiting.  Abdominal pain that lasts for 5 hours or more.  Fever or chills.  Yellowing of the skin or the whites of the eyes (jaundice).  Dark urine.  Light-colored stools. How is this diagnosed? This condition may be diagnosed based on:  A physical exam.  Your medical history.  An ultrasound of your gallbladder.  CT scan.  MRI.  Blood tests to check for signs of infection or inflammation.  A scan of your gallbladder and bile ducts (biliary system) using nonharmful radioactive material and special cameras that can see the radioactive material (cholescintigram). This test checks to see how your gallbladder contracts and whether bile ducts are blocked.  Inserting a small tube with a camera on the end (endoscope) through your mouth to inspect bile ducts and check for blockages (endoscopic retrograde cholangiopancreatogram). How is this treated? Treatment for gallstones depends on the severity of the condition.   Silent gallstones do not need treatment. If the gallstones cause a gallbladder attack or other symptoms, treatment may be required. Options for treatment include:  Surgery to remove the gallbladder (cholecystectomy). This is the most common treatment.  Medicines to dissolve gallstones.  These are most effective at treating small gallstones. You may need to take medicines for up to 6-12 months.  Shock wave treatment (extracorporeal biliary lithotripsy). In this treatment, an ultrasound machine sends shock waves to the gallbladder to break gallstones into smaller pieces. These pieces can then be passed into the intestines or be dissolved by medicine. This is rarely used.  Removing gallstones through endoscopic retrograde cholangiopancreatogram. A small basket can be attached to the endoscope and used to capture and remove gallstones. Follow these instructions at home:  Take over-the-counter and prescription medicines only as told by your health care provider.  Maintain a healthy weight and follow a healthy diet. This includes: ? Reducing fatty foods, such as fried food. ? Reducing refined carbohydrates, like white bread and white rice. ? Increasing fiber. Aim for foods like almonds, fruit, and beans.  Keep all follow-up visits as told by your health care provider. This is important. Contact a health care provider if:  You think you have had a gallbladder attack.  You have been diagnosed with silent gallstones and you develop abdominal pain or indigestion. Get help right away if:  You have pain from a gallbladder attack that lasts for more than 2 hours.  You have abdominal pain that lasts for more than 5 hours.  You have a fever or chills.  You have persistent nausea and vomiting.  You develop jaundice.  You have dark urine or light-colored stools. Summary  Cholelithiasis (also called gallstones) is a form of gallbladder disease in which gallstones form in the gallbladder.  This condition is caused by an imbalance in the substances that make up bile. This can happen if the bile has too much cholesterol, too much bilirubin, or not enough bile salts.  You are more likely to develop this condition if you are female, pregnant, using medicines with estrogen, obese,  older than age 77, or have a family history of gallstones. You may also develop gallstones if you have diabetes, an intestinal disease, cirrhosis, or metabolic syndrome.  Treatment for gallstones depends on the severity of the condition. Silent gallstones do not need treatment.  If gallstones cause a gallbladder attack or other symptoms, treatment may be needed. The most common treatment is surgery to remove the gallbladder. This information is not intended to replace advice given to you by your health care provider. Make sure you discuss any questions you have with your health care provider. Document Revised: 08/23/2017 Document Reviewed: 05/27/2016 Elsevier Patient Education  2020 [place]. Biliary Colic, Adult  Biliary colic is severe pain caused by a problem with a small organ in the upper right part of your belly (gallbladder). The gallbladder stores a digestive fluid produced in the liver (bile) that helps the body break down fat. Bile and other digestive enzymes are carried from the liver to the small intestine through tube-like structures (bile ducts). The gallbladder and the bile ducts form the biliary tract. Sometimes hard deposits of digestive fluids form in the gallbladder (gallstones) and block the flow of bile from the gallbladder, causing biliary colic. This condition is also called a gallbladder attack. Gallstones can be as small as a grain of sand or as big as a golf ball. There could be just one gallstone  in the gallbladder, or there could be many. What are the causes? Biliary colic is usually caused by gallstones. Less often, a tumor could block the flow of bile from the gallbladder and trigger biliary colic. What increases the risk? This condition is more likely to develop in:  Women.  People of Hispanic descent.  People with a family history of gallstones.  People who are obese.  People who suddenly or quickly lose weight.  People who eat a high-calorie, low-fiber  diet that is rich in refined carbs (carbohydrates), such as white bread and white rice.  People who have an intestinal disease that affects nutrient absorption, such as Crohn disease.  People who have a metabolic condition, such as metabolic syndrome or diabetes. What are the signs or symptoms? Severe pain in the upper right side of the belly is the main symptom of biliary colic. You may feel this pain below the chest but above the hip. This pain often occurs at night or after eating a very fatty meal. This pain may get worse for up to an hour and last as long as 12 hours. In most cases, the pain fades (subsides) within a couple hours. Other symptoms of this condition include:  Nausea and vomiting.  Pain under the right shoulder. How is this diagnosed? This condition is diagnosed based on your medical history, your symptoms, and a physical exam. You may have tests, including:  Blood tests to rule out infection or inflammation of the bile ducts, gallbladder, pancreas, or liver.  Imaging studies such as: ? Ultrasound. ? CT scan. ? MRI. In some cases, you may need to have an imaging study done using a small amount of radioactive material (nuclear medicine) to confirm the diagnosis. How is this treated? Treatment for this condition may include medicine to relieve your pain or nausea. If you have gallstones that are causing biliary colic, you may need surgery to remove the gallbladder (cholecystectomy). Gallstones can also be dissolved gradually with medicine. It may take months or years before the gallstones are completely gone. Follow these instructions at home:  Take over-the-counter and prescription medicines only as told by your health care provider.  Drink enough fluid to keep your urine clear or pale yellow.  Follow instructions from your health care provider about eating or drinking restrictions. These may include avoiding: ? Fatty, greasy, and fried foods. ? Any foods that make  the pain worse. ? Overeating. ? Having a large meal after not eating for a while.  Keep all follow-up visits as told by your health care provider. This is important. How is this prevented? Steps to prevent this condition include:  Maintaining a healthy body weight.  Getting regular exercise.  Eating a healthy, high-fiber, low-fat diet.  Limiting how much sugar and refined carbs you eat, such as sweets, white flour, and white rice. Contact a health care provider if:  Your pain lasts more than 5 hours.  You vomit.  You have a fever and chills.  Your pain gets worse. Get help right away if:  Your skin or the whites of your eyes look yellow (jaundice).  Your have tea-colored urine and light-colored stools.  You are dizzy or you faint. Summary  Biliary colic is severe pain caused by a problem with a small organ in the upper right part of your belly (gallbladder).  Treatments for this condition include medicines that relieves your pain or nausea and medicines that slowly dissolves the gallstones.  If gallstones cause your biliary colic,  the treatment is surgery to remove the gallbladder (cholecystectomy). This information is not intended to replace advice given to you by your health care provider. Make sure you discuss any questions you have with your health care provider. Document Revised: 08/23/2017 Document Reviewed: 03/26/2016 Elsevier Patient Education  [place].

## 2020-08-03 NOTE — Progress Notes (Signed)
Amy Finley; [id number]; 09/11/1941   HPI Patient is a 79 year old white female who was referred to my care by Dr. [name] for evaluation and treatment of biliary colic secondary to cholelithiasis.  Patient was seen by her primary care physician recently for right upper quadrant abdominal pain, nausea, and vomiting.  Ultrasound the gallbladder revealed cholelithiasis.  This occurred last week.  Since that time, her right upper quadrant abdominal pain and nausea have resolved.  She denies any fatty food intolerance.  This is her first episode.  She denies any current fever or chills. Past Medical History:  Diagnosis Date  . Cervical cancer ([place])    remote history at age 75  . Coronary artery disease    Myocardial infarction ST segment elevation treated with PCI and stenting of her apical LAD complicated by left main dissection requiring emergency CABG x 2 04/07/10  . Hyperlipidemia, mild    statin intolerant  . Ovarian cancer [facility])     Past Surgical History:  Procedure Laterality Date  . CARDIAC CATHETERIZATION  04/07/10     1.5 x 12 Flex balloon angioplasty.  angioplasty performed in the entire distal third ever restoring apical flow.  2.0 x 18 mini Vision stent deployed.  Dissection in proximal LAD extending back into left main and cirumflex.  Marland Kitchen CATARACT EXTRACTION W/PHACO Left 03/29/2015   Procedure: CATARACT EXTRACTION PHACO AND INTRAOCULAR LENS PLACEMENT;;  Surgeon: Williams Che, MD;  Location: [facility];  Service: Ophthalmology;  Laterality: Left;  CDE 7.86  . CATARACT EXTRACTION W/PHACO Right 08/01/2015   Procedure: CATARACT EXTRACTION PHACO AND INTRAOCULAR LENS PLACEMENT (IOC);  Surgeon: Williams Che, MD;  Location: [facility];  Service: Ophthalmology;  Laterality: Right;  CDE: 7.96  . COLONOSCOPY N/A 03/10/2018   Procedure: COLONOSCOPY;  Surgeon: Rogene Houston, MD;  Location: [facility];  Service: Endoscopy;  Laterality: N/A;  1:25  . CORONARY ARTERY BYPASS GRAFT  04/07/10    (emergency)  x2 using saphenous vein graft to mid left anterior descending coronary artery, saphenous vein graft to obtuse marginal branch and left circumflex coronary artery   . ORIF WRIST FRACTURE Left 2011  . POLYPECTOMY  03/10/2018   Procedure: POLYPECTOMY;  Surgeon: Rogene Houston, MD;  Location: [facility];  Service: Endoscopy;;  colon   . TOTAL ABDOMINAL HYSTERECTOMY  1976    Family History  Problem Relation Age of Onset  . Cancer Mother   . Diabetes Mother   . Congestive Heart Failure Father     Current Outpatient Medications on File Prior to Visit  Medication Sig Dispense Refill  . Ascorbic Acid (VITAMIN C) 100 MG tablet Take 100 mg by mouth daily.    Marland Kitchen b complex vitamins tablet Take 1 tablet by mouth daily.    . chlorpheniramine (CHLOR-TRIMETON) 4 MG tablet Take 4 mg by mouth daily.    . cholecalciferol (VITAMIN D3) 25 MCG (1000 UNIT) tablet Take 1,000 Units by mouth daily.    . DHA-EPA-Flaxseed Oil-Vitamin E (THERA TEARS NUTRITION PO) Place 1 drop into both eyes daily.    Marland Kitchen loratadine (CLARITIN) 10 MG tablet Take 10 mg by mouth daily.    Marland Kitchen MAGNESIUM CITRATE PO Take 250 mg by mouth.    . melatonin 5 MG TABS Take 5 mg by mouth.    . QUERCETIN PO Take 800 mg by mouth daily.     . Zinc Sulfate (ZINC 15 PO) Take by mouth.    Marland Kitchen aspirin EC 81 MG  tablet Take 81 mg by mouth daily. Swallow whole. (Patient not taking: Reported on 08/02/2020)    . [DISCONTINUED] cromolyn (NASALCROM) 5.2 MG/ACT nasal spray Place 1 spray into both nostrils 2 (two) times daily.      No current facility-administered medications on file prior to visit.    Allergies  Allergen Reactions  . Erythromycin Other (See Comments)    Severe abdominal pain.    Social History   Substance and Sexual Activity  Alcohol Use No    Social History   Tobacco Use  Smoking Status Former Smoker  . Packs/day: 1.50  . Years: 50.00  . Pack years: 75.00  . Types: Cigarettes  . Quit date: 04/07/2010  . Years  since quitting: 10.3  Smokeless Tobacco Never Used    Review of Systems  Constitutional: Negative.   HENT: Negative.   Eyes: Negative.   Respiratory: Negative.   Cardiovascular: Negative.   Gastrointestinal: Negative.   Genitourinary: Negative.   Musculoskeletal: Negative.   Skin: Negative.   Neurological: Negative.   Endo/Heme/Allergies: Negative.   Psychiatric/Behavioral: Negative.     Objective   Vitals:   08/02/20 1531  BP: 129/71  Pulse: 89  Resp: 14  Temp: 98.3 F (36.8 C)  SpO2: 94%    Physical Exam Vitals reviewed.  Constitutional:      Appearance: Normal appearance. She is normal weight. She is not ill-appearing.  HENT:     Head: Normocephalic and atraumatic.  Eyes:     General: No scleral icterus. Cardiovascular:     Rate and Rhythm: Normal rate and regular rhythm.     Heart sounds: Normal heart sounds. No murmur heard.  No friction rub. No gallop.   Pulmonary:     Effort: Pulmonary effort is normal. No respiratory distress.     Breath sounds: Normal breath sounds. No stridor. No wheezing, rhonchi or rales.  Abdominal:     General: Abdomen is flat. There is no distension.     Palpations: Abdomen is soft. There is no mass.     Tenderness: There is no abdominal tenderness. There is no guarding.     Hernia: No hernia is present.  Skin:    General: Skin is warm and dry.  Neurological:     Mental Status: She is alert and oriented to person, place, and time.    Ultrasound report reviewed Assessment  Cholelithiasis, one episode of biliary colic, resolved Plan   Patient would like to avoid any surgical intervention at this time, which is fine with me.  I did talk with her about the signs and symptoms of biliary colic.  She was given instructions to call me should she have recurrence of her symptoms.  Literature was given.  Follow-up here as needed.

## 2020-12-23 ENCOUNTER — Other Ambulatory Visit (HOSPITAL_COMMUNITY): Payer: Self-pay | Admitting: Internal Medicine

## 2020-12-23 DIAGNOSIS — Z1231 Encounter for screening mammogram for malignant neoplasm of breast: Secondary | ICD-10-CM

## 2021-01-09 ENCOUNTER — Ambulatory Visit (HOSPITAL_COMMUNITY)
Admission: RE | Admit: 2021-01-09 | Discharge: 2021-01-09 | Disposition: A | Discharge status: Home / Self Care | Payer: Medicare Other | Source: Ambulatory Visit | Attending: Internal Medicine | Admitting: Internal Medicine

## 2021-01-09 DIAGNOSIS — Z1231 Encounter for screening mammogram for malignant neoplasm of breast: Secondary | ICD-10-CM | POA: Insufficient documentation

## 2021-05-30 ENCOUNTER — Other Ambulatory Visit (HOSPITAL_COMMUNITY): Payer: Self-pay | Admitting: Internal Medicine

## 2021-05-30 DIAGNOSIS — M8580 Other specified disorders of bone density and structure, unspecified site: Secondary | ICD-10-CM

## 2021-06-12 ENCOUNTER — Other Ambulatory Visit: Payer: Self-pay

## 2021-06-12 ENCOUNTER — Ambulatory Visit (HOSPITAL_COMMUNITY)
Admission: RE | Admit: 2021-06-12 | Discharge: 2021-06-12 | Disposition: A | Discharge status: Home / Self Care | Payer: Medicare Other | Source: Ambulatory Visit | Attending: Internal Medicine | Admitting: Internal Medicine

## 2021-06-12 DIAGNOSIS — Z13820 Encounter for screening for osteoporosis: Secondary | ICD-10-CM | POA: Diagnosis present

## 2021-06-12 DIAGNOSIS — Z780 Asymptomatic menopausal state: Secondary | ICD-10-CM | POA: Diagnosis not present

## 2021-06-12 DIAGNOSIS — Z8543 Personal history of malignant neoplasm of ovary: Secondary | ICD-10-CM | POA: Insufficient documentation

## 2021-06-12 DIAGNOSIS — M810 Age-related osteoporosis without current pathological fracture: Secondary | ICD-10-CM | POA: Insufficient documentation

## 2021-06-12 DIAGNOSIS — M8580 Other specified disorders of bone density and structure, unspecified site: Secondary | ICD-10-CM

## 2022-03-20 ENCOUNTER — Other Ambulatory Visit (HOSPITAL_COMMUNITY): Payer: Self-pay | Admitting: Internal Medicine

## 2022-03-20 DIAGNOSIS — Z1231 Encounter for screening mammogram for malignant neoplasm of breast: Secondary | ICD-10-CM

## 2022-03-26 ENCOUNTER — Ambulatory Visit (HOSPITAL_COMMUNITY)
Admission: RE | Admit: 2022-03-26 | Discharge: 2022-03-26 | Disposition: A | Discharge status: Home / Self Care | Payer: Medicare Other | Source: Ambulatory Visit | Attending: Internal Medicine | Admitting: Internal Medicine

## 2022-03-26 DIAGNOSIS — Z1231 Encounter for screening mammogram for malignant neoplasm of breast: Secondary | ICD-10-CM | POA: Diagnosis present

## 2022-04-14 ENCOUNTER — Ambulatory Visit (INDEPENDENT_AMBULATORY_CARE_PROVIDER_SITE_OTHER): Payer: Medicare Other

## 2022-04-14 ENCOUNTER — Ambulatory Visit
Admission: EM | Admit: 2022-04-14 | Discharge: 2022-04-14 | Disposition: A | Discharge status: Home / Self Care | Payer: Medicare Other | Attending: Nurse Practitioner | Admitting: Nurse Practitioner

## 2022-04-14 DIAGNOSIS — R300 Dysuria: Secondary | ICD-10-CM

## 2022-04-14 DIAGNOSIS — K5900 Constipation, unspecified: Secondary | ICD-10-CM

## 2022-04-14 DIAGNOSIS — R109 Unspecified abdominal pain: Secondary | ICD-10-CM | POA: Insufficient documentation

## 2022-04-14 LAB — POCT URINALYSIS DIP (MANUAL ENTRY)
Bilirubin, UA: NEGATIVE
Glucose, UA: NEGATIVE mg/dL
Ketones, POC UA: NEGATIVE mg/dL
Leukocytes, UA: NEGATIVE
Nitrite, UA: NEGATIVE
Protein Ur, POC: NEGATIVE mg/dL
Spec Grav, UA: <=1.005 — AB (ref 1.010–1.025)
Urobilinogen, UA: 0.2 E.U./dL
pH, UA: 6 (ref 5.0–8.0)

## 2022-04-14 MED ORDER — SENNA 8.6 MG PO TABS: 2.0000 | ORAL_TABLET | Freq: Every day | ORAL | 0 refills | Status: AC

## 2022-04-14 MED ORDER — POLYETHYLENE GLYCOL 3350 17 GM/SCOOP PO POWD: 1.0000 | Freq: Once | ORAL | 0 refills | Status: AC

## 2022-04-14 NOTE — Discharge Instructions (Addendum)
Your urinalysis shows that you have blood in your urine.  A urine culture is pending.  You will be contacted if the results show that you need to be treated for urinary tract infection. Your x-rays show that you have constipation.  I am prescribing medication for you to take.  Take the MiraLAX twice daily until you have a sausagelike stool.  Once you have a sausagelike stool, you can start taking it daily as needed. Increase fluids.  You should be drinking at least 6-8 8 ounce glasses of water daily. Increase the fruits and fiber in your diet to help promote bowel motility. May take over-the-counter Tylenol or ibuprofen as needed for pain or discomfort. If you continue to have urinary symptoms, recommend following up with urology. Follow-up with your primary care physician if symptoms worsen or do not improve.

## 2022-04-14 NOTE — ED Triage Notes (Signed)
Pt reports, lower back pain, lower abdominal pain, burning when urinating  x 1 week.

## 2022-04-14 NOTE — ED Provider Notes (Signed)
[facility]    CSN: [id number] Arrival date & time: 04/14/22  1003      History   Chief Complaint Chief Complaint  Patient presents with   Dysuria    HPI Amy Finley is a 81 y.o. female.   The history is provided by the patient.   Patient presents for complaints of mid low back pain and dysuria.  Patient states symptoms started over the past week.  She states over the past 24 hours, she developed burning with urination and lower abdominal pain.  She states that she did work out in the garden during the week and noticed that her back pain started at that time.  She states that she has also had intermittent chills.  She denies fever, chills, urinary frequency, urgency, suprapubic pain, flank pain, or hematuria.  She denies previous history of kidney stones.  Past Medical History:  Diagnosis Date   Cervical cancer ([place])    remote history at age 3   Coronary artery disease    Myocardial infarction ST segment elevation treated with PCI and stenting of her apical LAD complicated by left main dissection requiring emergency CABG x 2 04/07/10   Hyperlipidemia, mild    statin intolerant   Ovarian cancer [place] [facility])     Patient Active Problem List   Diagnosis Date Noted   Positive FIT (fecal immunochemical test) 02/10/2018   Chest pain 05/31/2017   Hyperlipidemia 06/22/2014   CAD (coronary artery disease) 02/13/2013   CLOSED FRACTURE OF OTHER BONE OF WRIST 03/08/2010    Past Surgical History:  Procedure Laterality Date   CARDIAC CATHETERIZATION  04/07/10     1.5 x 12 Flex balloon angioplasty.  angioplasty performed in the entire distal third ever restoring apical flow.  2.0 x 18 mini Vision stent deployed.  Dissection in proximal LAD extending back into left main and cirumflex.   CATARACT EXTRACTION W/PHACO Left 03/29/2015   Procedure: CATARACT EXTRACTION PHACO AND INTRAOCULAR LENS PLACEMENT;;  Surgeon: Williams Che, MD;  Location: [facility];  Service: Ophthalmology;   Laterality: Left;  CDE 7.86   CATARACT EXTRACTION W/PHACO Right 08/01/2015   Procedure: CATARACT EXTRACTION PHACO AND INTRAOCULAR LENS PLACEMENT (IOC);  Surgeon: Williams Che, MD;  Location: [facility];  Service: Ophthalmology;  Laterality: Right;  CDE: 7.96   COLONOSCOPY N/A 03/10/2018   Procedure: COLONOSCOPY;  Surgeon: Rogene Houston, MD;  Location: [facility];  Service: Endoscopy;  Laterality: N/A;  1:25   CORONARY ARTERY BYPASS GRAFT  04/07/10   (emergency)  x2 using saphenous vein graft to mid left anterior descending coronary artery, saphenous vein graft to obtuse marginal branch and left circumflex coronary artery    ORIF WRIST FRACTURE Left 2011   POLYPECTOMY  03/10/2018   Procedure: POLYPECTOMY;  Surgeon: Rogene Houston, MD;  [facility];  Service: Endoscopy;;  colon    TOTAL ABDOMINAL HYSTERECTOMY  1976    OB History   No obstetric history on file.      Home Medications    Prior to Admission medications   Medication Sig Start Date End Date Taking? Authorizing Provider  polyethylene glycol powder (MIRALAX) 17 GM/SCOOP powder Take 255 g by mouth once for 1 dose. 04/14/22 04/14/22 Yes Cherisse Carrell-Warren, Alda Lea, NP  senna (SENOKOT) 8.6 MG TABS tablet Take 2 tablets (17.2 mg total) by mouth at bedtime. 04/14/22 05/14/22 Yes Jiles Goya-Warren, Alda Lea, NP  Ascorbic Acid (VITAMIN C) 100 MG tablet Take 100 mg by mouth daily.  [provider]  b complex vitamins tablet Take 1 tablet by mouth daily.     [provider]  chlorpheniramine (CHLOR-TRIMETON) 4 MG tablet Take 4 mg by mouth daily.    [provider]  cholecalciferol (VITAMIN D3) 25 MCG (1000 UNIT) tablet Take 1,000 Units by mouth daily.    [provider]  DHA-EPA-Flaxseed Oil-Vitamin E (THERA TEARS NUTRITION PO) Place 1 drop into both eyes daily.    [provider]  loratadine (CLARITIN) 10 MG tablet Take 10 mg by mouth daily.    [provider]  MAGNESIUM  CITRATE PO Take 250 mg by mouth.    [provider]  melatonin 5 MG TABS Take 5 mg by mouth.    [provider]  QUERCETIN PO Take 800 mg by mouth daily.     [provider]  Zinc Sulfate (ZINC 15 PO) Take by mouth.    [provider]  cromolyn (NASALCROM) 5.2 MG/ACT nasal spray Place 1 spray into both nostrils 2 (two) times daily.   04/02/20  [provider]    Family History Family History  Problem Relation Age of Onset   Cancer Mother    Diabetes Mother    Congestive Heart Failure Father     Social History Social History   Tobacco Use   Smoking status: Former    Packs/day: 1.50    Years: 50.00    Total pack years: 75.00    Types: Cigarettes    Quit date: 04/07/2010    Years since quitting: 12.0   Smokeless tobacco: Never  Vaping Use   Vaping Use: Never used  Substance Use Topics   Alcohol use: No   Drug use: No     Allergies   Erythromycin   Review of Systems Review of Systems Per HPI  Physical Exam Triage Vital Signs ED Triage Vitals  Enc Vitals Group     BP 04/14/22 1144 (!) 144/77     Pulse Rate 04/14/22 1144 87     Resp 04/14/22 1144 18     Temp 04/14/22 1144 98.3 F (36.8 C)     Temp Source 04/14/22 1144 Oral     SpO2 04/14/22 1144 93 %     Weight --      Height --      Head Circumference --      Peak Flow --      Pain Score 04/14/22 1145 5     Pain Loc --      Pain Edu? --      Excl. in [place]? --    No data found.  Updated Vital Signs BP (!) 144/77 (BP Location: Right Arm)   Pulse 87   Temp 98.3 F (36.8 C) (Oral)   Resp 18   SpO2 93%   Visual Acuity Right Eye Distance:   Left Eye Distance:   Bilateral Distance:    Right Eye Near:   Left Eye Near:    Bilateral Near:     Physical Exam Vitals and nursing note reviewed.  Constitutional:      General: She is not in acute distress.    Appearance: Normal appearance.  HENT:     Head: Normocephalic.  Eyes:     Extraocular Movements:  Extraocular movements intact.     Pupils: Pupils are equal, round, and reactive to light.  Cardiovascular:     Rate and Rhythm: Normal rate and regular rhythm.     Pulses: Normal pulses.  Heart sounds: Normal heart sounds.  Pulmonary:     Effort: Pulmonary effort is normal. No respiratory distress.     Breath sounds: Normal breath sounds. No wheezing or rales.  Abdominal:     General: Bowel sounds are normal.     Palpations: Abdomen is soft.     Tenderness: There is abdominal tenderness in the right upper quadrant and right lower quadrant. There is no right CVA tenderness, left CVA tenderness or guarding.  Musculoskeletal:     Cervical back: Normal range of motion.  Skin:    General: Skin is warm and dry.  Neurological:     General: No focal deficit present.     Mental Status: She is alert and oriented to person, place, and time.  Psychiatric:        Mood and Affect: Mood normal.        Behavior: Behavior normal.      UC Treatments / Results  Labs (all labs ordered are listed, but only abnormal results are displayed) Labs Reviewed  POCT URINALYSIS DIP (MANUAL ENTRY) - Abnormal; Notable for the following components:      Result Value   Clarity, UA hazy (*)    Spec Grav, UA <=1.005 (*)    Blood, UA moderate (*)    All other components within normal limits  URINE CULTURE    EKG   Radiology DG Abd 1 View  Result Date: 04/14/2022 CLINICAL DATA:  Abdominal pain, dysuria EXAM: ABDOMEN - 1 VIEW COMPARISON:  None Available. FINDINGS: Gas-filled, nondistended loops of small bowel and colon in the central and left hemiabdomen with gas and stool present to the rectum. Moderate burden of stool throughout the colon and rectum. IMPRESSION: 1. Nonobstructive bowel gas pattern. 2. Moderate burden of stool throughout the colon and rectum. Electronically Signed   By: Delanna Ahmadi M.D.   On: 04/14/2022 12:22    Procedures Procedures (including critical care time)  Medications  Ordered in UC Medications - No data to display  Initial Impression / Assessment and Plan / UC Course  I have reviewed the triage vital signs and the nursing notes.  Pertinent labs & imaging results that were available during my care of the patient were reviewed by me and considered in my medical decision making (see chart for details).  Patient presents for complaints of abdominal pain and low back pain with dysuria that started over the past week.  On exam, patient's back pain is located in her mid lower back.  She has no flank pain or CVA tenderness.  She also has no suprapubic pressure or tenderness.  She does have tenderness to the right lower and right upper quadrants of her abdomen.  Urinalysis is negative for an obvious urinary tract infection, she does show moderate blood.  Urine culture is pending.  Was concerned that there may be a kidney stone based on the blood seen in her urine.  Also discussed this with the patient.  X-ray does show stool burden consistent with constipation in the colon and rectum.  Will prescribe patient MiraLAX and senna for her constipation.  Supportive care recommendations were provided to the patient.  Patient was advised that if the culture results are negative and she is continue to have urinary symptoms, would recommend following up with urology.  Otherwise, she can follow-up with her PCP as needed. Final Clinical Impressions(s) / UC Diagnoses   Final diagnoses:  Abdominal pain, unspecified abdominal location  Constipation, unspecified constipation type  Discharge Instructions      Your urinalysis shows that you have blood in your urine.  A urine culture is pending.  You will be contacted if the results show that you need to be treated for urinary tract infection. Your x-rays show that you have constipation.  I am prescribing medication for you to take.  Take the MiraLAX twice daily until you have a sausagelike stool.  Once you have a sausagelike stool,  you can start taking it daily as needed. Increase fluids.  You should be drinking at least 6-8 8 ounce glasses of water daily. Increase the fruits and fiber in your diet to help promote bowel motility. May take over-the-counter Tylenol or ibuprofen as needed for pain or discomfort. If you continue to have urinary symptoms, recommend following up with urology. Follow-up with your primary care physician if symptoms worsen or do not improve.     ED Prescriptions     Medication Sig Dispense Auth. Provider   polyethylene glycol powder (MIRALAX) 17 GM/SCOOP powder Take 255 g by mouth once for 1 dose. 255 g Burton Gahan-Warren, Alda Lea, NP   senna (SENOKOT) 8.6 MG TABS tablet Take 2 tablets (17.2 mg total) by mouth at bedtime. 60 tablet Houa Nie-Warren, Alda Lea, NP      PDMP not reviewed this encounter.   Tish Men, NP 04/14/22 [phone]

## 2022-04-15 LAB — URINE CULTURE: Culture: NO GROWTH

## 2022-06-04 ENCOUNTER — Other Ambulatory Visit: Payer: Self-pay | Admitting: Internal Medicine

## 2022-06-13 ENCOUNTER — Emergency Department (HOSPITAL_COMMUNITY): Payer: Medicare Other

## 2022-06-13 ENCOUNTER — Other Ambulatory Visit: Payer: Self-pay

## 2022-06-13 ENCOUNTER — Inpatient Hospital Stay (HOSPITAL_COMMUNITY)
Admission: EM | Admit: 2022-06-13 | Discharge: 2022-06-17 | DRG: 988 | Disposition: A | Discharge status: Home / Self Care | Payer: Medicare Other | Attending: Internal Medicine | Admitting: Internal Medicine

## 2022-06-13 ENCOUNTER — Encounter (HOSPITAL_COMMUNITY): Payer: Self-pay | Admitting: *Deleted

## 2022-06-13 DIAGNOSIS — Z8249 Family history of ischemic heart disease and other diseases of the circulatory system: Secondary | ICD-10-CM

## 2022-06-13 DIAGNOSIS — Z881 Allergy status to other antibiotic agents status: Secondary | ICD-10-CM

## 2022-06-13 DIAGNOSIS — I2510 Atherosclerotic heart disease of native coronary artery without angina pectoris: Secondary | ICD-10-CM | POA: Diagnosis present

## 2022-06-13 DIAGNOSIS — E785 Hyperlipidemia, unspecified: Principal | ICD-10-CM | POA: Diagnosis present

## 2022-06-13 DIAGNOSIS — R9431 Abnormal electrocardiogram [ECG] [EKG]: Secondary | ICD-10-CM

## 2022-06-13 DIAGNOSIS — R112 Nausea with vomiting, unspecified: Secondary | ICD-10-CM

## 2022-06-13 DIAGNOSIS — R778 Other specified abnormalities of plasma proteins: Secondary | ICD-10-CM | POA: Diagnosis not present

## 2022-06-13 DIAGNOSIS — E876 Hypokalemia: Secondary | ICD-10-CM | POA: Diagnosis present

## 2022-06-13 DIAGNOSIS — Z833 Family history of diabetes mellitus: Secondary | ICD-10-CM | POA: Diagnosis not present

## 2022-06-13 DIAGNOSIS — K8062 Calculus of gallbladder and bile duct with acute cholecystitis without obstruction: Secondary | ICD-10-CM | POA: Diagnosis present

## 2022-06-13 DIAGNOSIS — R739 Hyperglycemia, unspecified: Secondary | ICD-10-CM | POA: Diagnosis present

## 2022-06-13 DIAGNOSIS — Z951 Presence of aortocoronary bypass graft: Secondary | ICD-10-CM | POA: Diagnosis not present

## 2022-06-13 DIAGNOSIS — Z8543 Personal history of malignant neoplasm of ovary: Secondary | ICD-10-CM | POA: Diagnosis not present

## 2022-06-13 DIAGNOSIS — Z79899 Other long term (current) drug therapy: Secondary | ICD-10-CM

## 2022-06-13 DIAGNOSIS — Z7982 Long term (current) use of aspirin: Secondary | ICD-10-CM

## 2022-06-13 DIAGNOSIS — I248 Other forms of acute ischemic heart disease: Secondary | ICD-10-CM | POA: Diagnosis present

## 2022-06-13 DIAGNOSIS — Z87891 Personal history of nicotine dependence: Secondary | ICD-10-CM

## 2022-06-13 DIAGNOSIS — R109 Unspecified abdominal pain: Secondary | ICD-10-CM

## 2022-06-13 DIAGNOSIS — I252 Old myocardial infarction: Secondary | ICD-10-CM

## 2022-06-13 DIAGNOSIS — Z01810 Encounter for preprocedural cardiovascular examination: Secondary | ICD-10-CM | POA: Diagnosis not present

## 2022-06-13 DIAGNOSIS — K810 Acute cholecystitis: Secondary | ICD-10-CM

## 2022-06-13 DIAGNOSIS — K8042 Calculus of bile duct with acute cholecystitis without obstruction: Principal | ICD-10-CM | POA: Diagnosis present

## 2022-06-13 DIAGNOSIS — Z8541 Personal history of malignant neoplasm of cervix uteri: Secondary | ICD-10-CM | POA: Diagnosis not present

## 2022-06-13 DIAGNOSIS — Z955 Presence of coronary angioplasty implant and graft: Secondary | ICD-10-CM

## 2022-06-13 DIAGNOSIS — I255 Ischemic cardiomyopathy: Secondary | ICD-10-CM | POA: Diagnosis not present

## 2022-06-13 DIAGNOSIS — K8050 Calculus of bile duct without cholangitis or cholecystitis without obstruction: Secondary | ICD-10-CM | POA: Diagnosis not present

## 2022-06-13 DIAGNOSIS — R1031 Right lower quadrant pain: Secondary | ICD-10-CM

## 2022-06-13 LAB — COMPREHENSIVE METABOLIC PANEL
ALT: 23 U/L (ref 0–44)
AST: 29 U/L (ref 15–41)
Albumin: 4 g/dL (ref 3.5–5.0)
Alkaline Phosphatase: 68 U/L (ref 38–126)
Anion gap: 9 (ref 5–15)
BUN: 12 mg/dL (ref 8–23)
CO2: 29 mmol/L (ref 22–32)
Calcium: 8.7 mg/dL — ABNORMAL LOW (ref 8.9–10.3)
Chloride: 99 mmol/L (ref 98–111)
Creatinine, Ser: 0.63 mg/dL (ref 0.44–1.00)
GFR, Estimated: >60 mL/min (ref >60)
Glucose, Bld: 152 mg/dL — ABNORMAL HIGH (ref 70–99)
Potassium: 2.9 mmol/L — ABNORMAL LOW (ref 3.5–5.1)
Sodium: 137 mmol/L (ref 135–145)
Total Bilirubin: 0.9 mg/dL (ref 0.3–1.2)
Total Protein: 7.1 g/dL (ref 6.5–8.1)

## 2022-06-13 LAB — CBC WITH DIFFERENTIAL/PLATELET
Abs Immature Granulocytes: 0.05 10*3/uL (ref 0.00–0.07)
Basophils Absolute: 0.1 10*3/uL (ref 0.0–0.1)
Basophils Relative: 0 %
Eosinophils Absolute: 0 10*3/uL (ref 0.0–0.5)
Eosinophils Relative: 0 %
HCT: 43.9 % (ref 36.0–46.0)
Hemoglobin: 14.9 g/dL (ref 12.0–15.0)
Immature Granulocytes: 0 %
Lymphocytes Relative: 7 %
Lymphs Abs: 1 10*3/uL (ref 0.7–4.0)
MCH: 29.7 pg (ref 26.0–34.0)
MCHC: 33.9 g/dL (ref 30.0–36.0)
MCV: 87.5 fL (ref 80.0–100.0)
Monocytes Absolute: 1.1 10*3/uL — ABNORMAL HIGH (ref 0.1–1.0)
Monocytes Relative: 7 %
Neutro Abs: 12.6 10*3/uL — ABNORMAL HIGH (ref 1.7–7.7)
Neutrophils Relative %: 86 %
Platelets: 250 10*3/uL (ref 150–400)
RBC: 5.02 MIL/uL (ref 3.87–5.11)
RDW: 12.6 % (ref 11.5–15.5)
WBC: 14.7 10*3/uL — ABNORMAL HIGH (ref 4.0–10.5)
nRBC: 0 % (ref 0.0–0.2)

## 2022-06-13 LAB — URINALYSIS, ROUTINE W REFLEX MICROSCOPIC
Bilirubin Urine: NEGATIVE
Glucose, UA: NEGATIVE mg/dL
Ketones, ur: NEGATIVE mg/dL
Leukocytes,Ua: NEGATIVE
Nitrite: NEGATIVE
Protein, ur: NEGATIVE mg/dL
Specific Gravity, Urine: 1.025 (ref 1.005–1.030)
pH: 7 (ref 5.0–8.0)

## 2022-06-13 LAB — TROPONIN I (HIGH SENSITIVITY)
Troponin I (High Sensitivity): 122 ng/L (ref <18)
Troponin I (High Sensitivity): 121 ng/L (ref <18)

## 2022-06-13 LAB — LIPASE, BLOOD: Lipase: 26 U/L (ref 11–51)

## 2022-06-13 MED ORDER — SODIUM CHLORIDE 0.9 % IV SOLN
1.0000 g | INTRAVENOUS | Status: DC
  Administered 2022-06-14 – 2022-06-15 (×2): 1 g via INTRAVENOUS
  Filled 2022-06-13 (×2): qty 10

## 2022-06-13 MED ORDER — ACETAMINOPHEN 325 MG PO TABS
650.0000 mg | ORAL_TABLET | Freq: Four times a day (QID) | ORAL | Status: DC | PRN
  Administered 2022-06-15 – 2022-06-16 (×2): 650 mg via ORAL
  Filled 2022-06-13 (×2): qty 2

## 2022-06-13 MED ORDER — MORPHINE SULFATE (PF) 2 MG/ML IV SOLN: 2.0000 mg | INTRAVENOUS | Status: DC | PRN

## 2022-06-13 MED ORDER — POTASSIUM CHLORIDE 10 MEQ/100ML IV SOLN
10.0000 meq | INTRAVENOUS | Status: AC
  Administered 2022-06-13 – 2022-06-14 (×2): 10 meq via INTRAVENOUS
  Filled 2022-06-13 (×2): qty 100

## 2022-06-13 MED ORDER — MORPHINE SULFATE (PF) 2 MG/ML IV SOLN
2.0000 mg | Freq: Once | INTRAVENOUS | Status: AC
  Administered 2022-06-13: 2 mg via INTRAVENOUS
  Filled 2022-06-13: qty 1

## 2022-06-13 MED ORDER — SODIUM CHLORIDE 0.9 % IV SOLN: INTRAVENOUS | Status: AC

## 2022-06-13 MED ORDER — SODIUM CHLORIDE 0.9 % IV SOLN: Freq: Once | INTRAVENOUS | Status: AC

## 2022-06-13 MED ORDER — GADOBUTROL 1 MMOL/ML IV SOLN
7.0000 mL | Freq: Once | INTRAVENOUS | Status: AC | PRN
  Administered 2022-06-13: 7 mL via INTRAVENOUS

## 2022-06-13 MED ORDER — ONDANSETRON HCL 4 MG/2ML IJ SOLN: 4.0000 mg | Freq: Four times a day (QID) | INTRAMUSCULAR | Status: DC | PRN

## 2022-06-13 MED ORDER — ONDANSETRON HCL 4 MG/2ML IJ SOLN
4.0000 mg | Freq: Four times a day (QID) | INTRAMUSCULAR | Status: DC | PRN
  Administered 2022-06-15: 4 mg via INTRAVENOUS

## 2022-06-13 MED ORDER — IOHEXOL 300 MG/ML  SOLN
100.0000 mL | Freq: Once | INTRAMUSCULAR | Status: AC | PRN
  Administered 2022-06-13: 100 mL via INTRAVENOUS

## 2022-06-13 MED ORDER — ONDANSETRON HCL 4 MG/2ML IJ SOLN
4.0000 mg | Freq: Once | INTRAMUSCULAR | Status: AC | PRN
Start: 2022-06-13 — End: 2022-06-13
  Administered 2022-06-13: 4 mg via INTRAVENOUS
  Filled 2022-06-13: qty 2

## 2022-06-13 MED ORDER — ONDANSETRON HCL 4 MG PO TABS: 4.0000 mg | ORAL_TABLET | Freq: Four times a day (QID) | ORAL | Status: DC | PRN

## 2022-06-13 MED ORDER — ACETAMINOPHEN 650 MG RE SUPP: 650.0000 mg | Freq: Four times a day (QID) | RECTAL | Status: DC | PRN

## 2022-06-13 MED ORDER — SODIUM CHLORIDE 0.9 % IV SOLN
1.0000 g | Freq: Once | INTRAVENOUS | Status: AC
  Administered 2022-06-13: 1 g via INTRAVENOUS
  Filled 2022-06-13: qty 10

## 2022-06-13 MED ORDER — POTASSIUM CHLORIDE 10 MEQ/100ML IV SOLN
10.0000 meq | INTRAVENOUS | Status: AC
  Administered 2022-06-13 (×2): 10 meq via INTRAVENOUS
  Filled 2022-06-13 (×2): qty 100

## 2022-06-13 MED ORDER — METRONIDAZOLE 500 MG/100ML IV SOLN
500.0000 mg | Freq: Two times a day (BID) | INTRAVENOUS | Status: DC
  Administered 2022-06-13 – 2022-06-16 (×6): 500 mg via INTRAVENOUS
  Filled 2022-06-13 (×6): qty 100

## 2022-06-13 NOTE — ED Notes (Signed)
ED PA at BS 

## 2022-06-13 NOTE — ED Notes (Signed)
To CT, alert, NAD, calm, resting comfortably, pain and nausea improved.

## 2022-06-13 NOTE — Consult Note (Signed)
Cardiology Consultation   Patient ID: Amy Finley MRN: [id number]; DOB: Nov 26, 1940  Admit date: 06/13/2022 Date of Consult: 06/13/2022  PCP:  [name], MD   [facility] Providers Cardiologist:  None        Patient Profile:   Amy Finley is a 81 y.o. female with a hx of coronary artery disease who is being seen 06/13/2022 for the evaluation of elevated troponin flat 100 at the request of Dr. Rogene Houston.  History of Present Illness:   Amy Finley is a 81 year old female who presented here with right upper quadrant pain, elevated white count of 15,000, hypokalemia potassium 2.9 after vomiting about 6 times since yesterday afternoon consistent with acute cholecystitis.  She has not been having any chest discomfort.  No significant shortness of breath.  She did appear fairly ill on arrival.  A troponin was drawn and demonstrated values of 122 and 121 mildly elevated (remember prior to high-sensitivity troponin this value would have been 0.1).    She states that she walks 2 to 3 miles 4 days a week without difficulty.  She has a previous history of ST segment elevation myocardial infarction treated with percutaneous intervention and stenting of her apical LAD which was complicated by left main dissection requiring emergent CABG x2 in July 2011.  She had a vein graft to her LAD and circumflex.  Her ejection fraction intraoperatively was 30 to 35% by echo in June 30, 2010.  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  Prior Myoview showed scar in the LAD territory.  She has remained relatively asymptomatic.  At home she has been taking her aspirin  Former smoker who quit in 2011 after 50 years.  Has a longstanding history of right bundle branch block as well as anterior Q waves.   Past Medical History:  Diagnosis Date   Cervical cancer ([place])    remote  history at age 33   Coronary artery disease    Myocardial infarction ST segment elevation treated with PCI and stenting of her apical LAD complicated by left main dissection requiring emergency CABG x 2 04/07/10   Hyperlipidemia, mild    statin intolerant   Ovarian cancer [facility])     Past Surgical History:  Procedure Laterality Date   CARDIAC CATHETERIZATION  04/07/10     1.5 x 12 Flex balloon angioplasty.  angioplasty performed in the entire distal third ever restoring apical flow.  2.0 x 18 mini Vision stent deployed.  Dissection in proximal LAD extending back into left main and cirumflex.   CATARACT EXTRACTION W/PHACO Left 03/29/2015   Procedure: CATARACT EXTRACTION PHACO AND INTRAOCULAR LENS PLACEMENT;;  Surgeon: Williams Che, MD;  Location: [facility];  Service: Ophthalmology;  Laterality: Left;  CDE 7.86   CATARACT EXTRACTION W/PHACO Right 08/01/2015   Procedure: CATARACT EXTRACTION PHACO AND INTRAOCULAR LENS PLACEMENT (IOC);  Surgeon: Williams Che, MD;  Location: [facility];  Service: Ophthalmology;  Laterality: Right;  CDE: 7.96   COLONOSCOPY N/A 03/10/2018   Procedure: COLONOSCOPY;  Surgeon: Rogene Houston, MD;  [facility]: [facility];  Service: Endoscopy;  Laterality: N/A;  1:25   CORONARY ARTERY BYPASS GRAFT  04/07/10   (emergency)  x2 using saphenous vein graft to mid left anterior descending coronary artery, saphenous vein graft to obtuse marginal branch and left circumflex coronary artery    ORIF WRIST FRACTURE Left 2011   POLYPECTOMY  03/10/2018  Procedure: POLYPECTOMY;  Surgeon: Rogene Houston, MD;  [facility];  Service: Endoscopy;;  colon    TOTAL ABDOMINAL HYSTERECTOMY  1976     Home Medications:  Prior to Admission medications   Medication Sig Start Date End Date Taking? Authorizing Provider  aspirin EC 81 MG tablet Take 81 mg by mouth daily. Swallow whole.   Yes [provider]  b complex vitamins tablet Take 1 tablet by mouth daily.    Yes  [provider]  chlorpheniramine (CHLOR-TRIMETON) 4 MG tablet Take 4 mg by mouth daily.   Yes [provider]  cholecalciferol (VITAMIN D3) 25 MCG (1000 UNIT) tablet Take 1,000 Units by mouth daily.   Yes [provider]  DHA-EPA-Flaxseed Oil-Vitamin E (THERA TEARS NUTRITION PO) Place 1 drop into both eyes daily.   Yes [provider]  melatonin 5 MG TABS Take 5 mg by mouth at bedtime as needed (sleep).   Yes [provider]  QUERCETIN PO Take 800 mg by mouth daily.    Yes [provider]  cromolyn (NASALCROM) 5.2 MG/ACT nasal spray Place 1 spray into both nostrils 2 (two) times daily.   04/02/20  [provider]    Inpatient Medications: Scheduled Meds:  Continuous Infusions:  potassium chloride 10 mEq (06/13/22 1528)   PRN Meds:   Allergies:    Allergies  Allergen Reactions   Erythromycin Other (See Comments)    Severe abdominal pain.    Social History:   Social History   Socioeconomic History   Marital status: Divorced    Spouse name: Not on file   Number of children: 2   Years of education: Not on file   Highest education level: Not on file  Occupational History   Not on file  Tobacco Use   Smoking status: Former    Packs/day: 1.50    Years: 50.00    Total pack years: 75.00    Types: Cigarettes    Quit date: 04/07/2010    Years since quitting: 12.1   Smokeless tobacco: Never  Vaping Use   Vaping Use: Never used  Substance and Sexual Activity   Alcohol use: No   Drug use: No   Sexual activity: Not on file  Other Topics Concern   Not on file  Social History Narrative   Not on file   Social Determinants of Health   Financial Resource Strain: Not on file  Food Insecurity: Not on file  Transportation Needs: Not on file  Physical Activity: Not on file  Stress: Not on file  Social Connections: Not on file  Intimate Partner Violence: Not on file    Family History:    Family History  Problem  Relation Age of Onset   Cancer Mother    Diabetes Mother    Congestive Heart Failure Father      ROS:  Please see the history of present illness.   All other ROS reviewed and negative.     Physical Exam/Data:   Vitals:   06/13/22 1330 06/13/22 1345 06/13/22 1430 06/13/22 1445  BP: (!) 162/58 (!) 147/58 (!) 163/69 (!) 151/58  Pulse: (!) 42  71 75  Resp: '20 12 14 15  '$ Temp:      TempSrc:      SpO2: 90%  97% 93%   No intake or output data in the 24 hours ending 06/13/22 1537    08/02/2020    3:31 PM 07/01/2020    9:23 AM 04/02/2020  12:24 PM  Last 3 Weights  Weight (lbs) 150 lb 155 lb 152 lb  Weight (kg) 68.04 kg 70.308 kg 68.947 kg     There is no height or weight on file to calculate BMI.  General:  Well nourished, well developed, in no acute distress HEENT: normal Neck: no JVD Vascular: No carotid bruits; Distal pulses 2+ bilaterally Cardiac:  normal S1, S2; RRR; no murmur  Lungs:  clear to auscultation bilaterally, no wheezing, rhonchi or rales  Abd: soft, tender right upper quadrant, no hepatomegaly  Ext: no edema Musculoskeletal:  No deformities, BUE and BLE strength normal and equal Skin: warm and dry  Neuro:  CNs 2-12 intact, no focal abnormalities noted Psych:  Normal affect   EKG:  The EKG was personally reviewed and demonstrates: Sinus rhythm right bundle branch block with no ischemic changes no change from prior Telemetry:  Telemetry was personally reviewed and demonstrates: No adverse arrhythmias  Relevant CV Studies: Prior cardiac catheterization in 2011 reviewed  Laboratory Data:  High Sensitivity Troponin:   Recent Labs  Lab 06/13/22 1208 06/13/22 1355  TROPONINIHS 122* 121*     Chemistry Recent Labs  Lab 06/13/22 1208  NA 137  K 2.9*  CL 99  CO2 29  GLUCOSE 152*  BUN 12  CREATININE 0.63  CALCIUM 8.7*  GFRNONAA >60  ANIONGAP 9    Recent Labs  Lab 06/13/22 1208  PROT 7.1  ALBUMIN 4.0  AST 29  ALT 23  ALKPHOS 68  BILITOT 0.9    Lipids No results for input(s): "CHOL", "TRIG", "HDL", "LABVLDL", "LDLCALC", "CHOLHDL" in the last 168 hours.  Hematology Recent Labs  Lab 06/13/22 1208  WBC 14.7*  RBC 5.02  HGB 14.9  HCT 43.9  MCV 87.5  MCH 29.7  MCHC 33.9  RDW 12.6  PLT 250   Thyroid No results for input(s): "TSH", "FREET4" in the last 168 hours.  BNPNo results for input(s): "BNP", "PROBNP" in the last 168 hours.  DDimer No results for input(s): "DDIMER" in the last 168 hours.   Radiology/Studies:  DG Chest Portable 1 View  Result Date: 06/13/2022 CLINICAL DATA:  RUQ pain, elevated trop EXAM: PORTABLE CHEST 1 VIEW COMPARISON:  Radiograph 05/31/2017 FINDINGS: Unchanged cardiomediastinal silhouette with prior median sternotomy. No focal airspace consolidation. There is no large pleural effusion. No pneumothorax. IMPRESSION: No evidence of acute cardiopulmonary disease. Electronically Signed   By: Maurine Simmering M.D.   On: 06/13/2022 14:24   CT ABDOMEN PELVIS W CONTRAST  Result Date: 06/13/2022 CLINICAL DATA:  Right upper quadrant pain EXAM: CT ABDOMEN AND PELVIS WITH CONTRAST TECHNIQUE: Multidetector CT imaging of the abdomen and pelvis was performed using the standard protocol following bolus administration of intravenous contrast. RADIATION DOSE REDUCTION: This exam was performed according to the departmental dose-optimization program which includes automated exposure control, adjustment of the mA and/or kV according to patient size and/or use of iterative reconstruction technique. CONTRAST:  118m OMNIPAQUE IOHEXOL 300 MG/ML  SOLN COMPARISON:  None Available. FINDINGS: Lower chest: Bibasilar atelectasis.  Status post median sternotomy. Hepatobiliary: No focal liver lesions are visualized. There is mild intrahepatic biliary ductal dilatation. There is cholelithiasis and gallbladder sludge with marked gallbladder wall thickening measuring up to 7 mm. There is mucosal hyperenhancement. The common bile duct is also  slightly enlarged for age measuring up to 9 mm. No definite radiopaque stone is visualized within the common bile duct. Pancreas: Unremarkable. No pancreatic ductal dilatation or surrounding inflammatory changes. Spleen: Normal in size  without focal abnormality. Adrenals/Urinary Tract: Adrenal glands are unremarkable. Kidneys are normal, without renal calculi, focal lesion, or hydronephrosis. Urinary bladder is fluid-filled and slightly distended. Stomach/Bowel: Stomach is within normal limits. Appendix appears normal. No evidence of bowel wall thickening, distention, or inflammatory changes. There is diverticulosis without diverticulitis. Vascular/Lymphatic: No significant vascular findings are present. No enlarged abdominal or pelvic lymph nodes. Reproductive: Status post hysterectomy. No adnexal masses. There is trace fluid in the pelvis. Other: No abdominal wall hernia or abnormality. No abdominopelvic ascites. Musculoskeletal: No acute or significant osseous findings. IMPRESSION: Findings are worrisome for acute cholecystitis. There is also intra and extrahepatic biliary ductal dilatation, which raises the possibility for choledocholithiasis. Electronically Signed   By: Marin Roberts M.D.   On: 06/13/2022 14:11     Assessment and Plan:   Elevated troponin - Flat elevated compatible with myocardial injury in the setting of acute cholecystitis.  Does not appear to be acute coronary syndrome or non-STEMI -This mildly elevated troponin value however does worsen overall prognosis. -I would not utilize IV heparin.  No need for cardiac rehabilitation or cardiac catheterization. -I will check an echocardiogram since it has been several years.  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  Coronary artery disease status post STEMI with prior bypass -Notes reviewed as above.  Has been stable over the past few years.  Post MI left ventricular apical aneurysm.  No evidence of prior thrombus.  Has been on  aspirin.  She has not been on an chronic statin therapy.  Acute cholecystitis - Right upper quadrant pain with elevated white count.  She is going to have GI evaluation.  If endoscopy is necessary or if surgery is necessary, she may proceed from a cardiac perspective with moderate overall risk based upon her prior coronary artery disease history  Right bundle branch block - Chronic should be of no clinical significance  Hypokalemia - Secondary to vomiting.  Replete per primary team  We will follow-up with results of echocardiogram  Risk Assessment/Risk Scores:           For questions or updates, please contact [place] Please consult [email or web address] for contact info under    Signed, Candee Furbish, MD  06/13/2022 3:37 PM

## 2022-06-13 NOTE — H&P (Signed)
History and Physical    Patient: Amy Finley HYQ:[id number] DOB: 12-31-1940 DOA: 06/13/2022 DOS: the patient was seen and examined on 06/13/2022 PCP: [name], MD  Patient coming from: Home  Chief Complaint:  Chief Complaint  Patient presents with   Abdominal Pain   HPI: Amy Finley is a 81 y.o. female with medical history significant of coronary artery disease status post stenting and subsequent emergent CABG for left main dissection in 2011 who presents to the emergency department due to abdominal pain which started this morning.  Patient complained of nausea and vomiting which started yesterday, she states that she had a chocolate cookie in the afternoon yesterday and shortly after this, she felt sick to her stomach and vomited, she had other episodes of vomiting after trying to eat, this was followed by generalized weakness and decreased appetite.  On waking up this morning, she developed abdominal pain in the right upper quadrant which was rated as 6/10 on pain scale.  She thought it was due to gallstones since she had similar symptoms last year that subsequently resolved and did not do any surgery at that time.  She called her PCP who asked her to go to the ED for further evaluation and management. She denies fever, chills, chest pain, shortness of breath, blurry vision.  ED Course:  In the emergency department, BP was 154/63 on arrival and other vital signs were within normal range.  Work-up in the ED showed normal CBC except for leukocytosis.  BMP was normal except for hypokalemia and hyperglycemia.  Lipase 26, troponin x 2 - 122 > 121, urinalysis was unimpressive for UTI. Right upper quadrant ultrasound showed findings concerning for acute cholecystitis, dilated common bile duct, raising concern for possible choledocholithiasis.  MRCP could further evaluate. MRI abdomen without and with contrast (including MRCP) showed: 1. Signal void in the distal common bile duct just above the  ampulla is most consistent with choledocholithiasis. 2. Mild intrahepatic and extrahepatic biliary duct dilatation. 3. Multiple gallstones and pericholecystic fluid consistent with acute cholecystitis. IV morphine was given due to abdominal pain, potassium was replenished, empiric IV ceftriaxone and metronidazole were given due to acute cholecystitis, IV hydration was provided. Cardiologist (Dr. Marlou Porch) was consulted due to elevated troponin, however since troponin already flattened, this was thought to be due to myocardial injury in the setting of acute cholecystitis. Gastroenterologist (Dr. Deno Etienne with [facility]) was consulted and it was recommended for patient to be transferred to [facility] for ERCP. Hospitalist was asked to admit patient for further evaluation and management.   Review of Systems: Review of systems as noted in the HPI. All other systems reviewed and are negative.   Past Medical History:  Diagnosis Date   Cervical cancer ([place])    remote history at age 69   Coronary artery disease    Myocardial infarction ST segment elevation treated with PCI and stenting of her apical LAD complicated by left main dissection requiring emergency CABG x 2 04/07/10   Hyperlipidemia, mild    statin intolerant   Ovarian cancer [facility])    Past Surgical History:  Procedure Laterality Date   CARDIAC CATHETERIZATION  04/07/10     1.5 x 12 Flex balloon angioplasty.  angioplasty performed in the entire distal third ever restoring apical flow.  2.0 x 18 mini Vision stent deployed.  Dissection in proximal LAD extending back into left main and cirumflex.   CATARACT EXTRACTION W/PHACO Left 03/29/2015   Procedure: CATARACT EXTRACTION PHACO AND INTRAOCULAR LENS  PLACEMENT;;  Surgeon: Williams Che, MD;  Location: [facility];  Service: Ophthalmology;  Laterality: Left;  CDE 7.86   CATARACT EXTRACTION W/PHACO Right 08/01/2015   Procedure: CATARACT EXTRACTION PHACO AND INTRAOCULAR LENS PLACEMENT (IOC);  Surgeon:  Williams Che, MD;  Location: [facility];  Service: Ophthalmology;  Laterality: Right;  CDE: 7.96   COLONOSCOPY N/A 03/10/2018   Procedure: COLONOSCOPY;  Surgeon: Rogene Houston, MD;  Location: [facility];  Service: Endoscopy;  Laterality: N/A;  1:25   CORONARY ARTERY BYPASS GRAFT  04/07/10   (emergency)  x2 using saphenous vein graft to mid left anterior descending coronary artery, saphenous vein graft to obtuse marginal branch and left circumflex coronary artery    ORIF WRIST FRACTURE Left 2011   POLYPECTOMY  03/10/2018   Procedure: POLYPECTOMY;  Surgeon: Rogene Houston, MD;  Location: [facility];  Service: Endoscopy;;  colon    TOTAL ABDOMINAL HYSTERECTOMY  1976    Social History:  reports that she quit smoking about 12 years ago. Her smoking use included cigarettes. She has a 75.00 pack-year smoking history. She has never used smokeless tobacco. She reports that she does not drink alcohol and does not use drugs.   Allergies  Allergen Reactions   Erythromycin Other (See Comments)    Severe abdominal pain.    Family History  Problem Relation Age of Onset   Cancer Mother    Diabetes Mother    Congestive Heart Failure Father      Prior to Admission medications   Medication Sig Start Date End Date Taking? Authorizing Provider  aspirin EC 81 MG tablet Take 81 mg by mouth daily. Swallow whole.   Yes [provider]  b complex vitamins tablet Take 1 tablet by mouth daily.    Yes [provider]  chlorpheniramine (CHLOR-TRIMETON) 4 MG tablet Take 4 mg by mouth daily.   Yes [provider]  cholecalciferol (VITAMIN D3) 25 MCG (1000 UNIT) tablet Take 1,000 Units by mouth daily.   Yes [provider]  DHA-EPA-Flaxseed Oil-Vitamin E (THERA TEARS NUTRITION PO) Place 1 drop into both eyes daily.   Yes [provider]  melatonin 5 MG TABS Take 5 mg by mouth at bedtime as needed (sleep).   Yes [provider]  QUERCETIN PO Take 800  mg by mouth daily.    Yes [provider]  cromolyn (NASALCROM) 5.2 MG/ACT nasal spray Place 1 spray into both nostrils 2 (two) times daily.   04/02/20  [provider]    Physical Exam: BP (!) 153/56   Pulse 92   Temp 98.7 F (37.1 C) (Oral)   Resp 18   SpO2 97%   General: 81 y.o. year-old female well developed well nourished in no acute distress.  Alert and oriented x3. HEENT: NCAT, EOMI Neck: Supple, trachea medial Cardiovascular: Regular rate and rhythm with no rubs or gallops.  No thyromegaly or JVD noted.  No lower extremity edema. 2/4 pulses in all 4 extremities. Respiratory: Clear to auscultation with no wheezes or rales. Good inspiratory effort. Abdomen: Soft, tender to palpation of RUQ.  Nondistended with normal bowel sounds x4 quadrants. Muskuloskeletal: No cyanosis, clubbing or edema noted bilaterally Neuro: CN II-XII intact, strength 5/5 x 4, sensation, reflexes intact Skin: No ulcerative lesions noted or rashes Psychiatry: Judgement and insight appear normal. Mood is appropriate for condition and setting          Labs on Admission:  Basic Metabolic Panel: Recent Labs  Lab 06/13/22 1208  NA 137  K 2.9*  CL 99  CO2 29  GLUCOSE 152*  BUN 12  CREATININE 0.63  CALCIUM 8.7*   Liver Function Tests: Recent Labs  Lab 06/13/22 1208  AST 29  ALT 23  ALKPHOS 68  BILITOT 0.9  PROT 7.1  ALBUMIN 4.0   Recent Labs  Lab 06/13/22 1208  LIPASE 26   No results for input(s): "AMMONIA" in the last 168 hours. CBC: Recent Labs  Lab 06/13/22 1208  WBC 14.7*  NEUTROABS 12.6*  HGB 14.9  HCT 43.9  MCV 87.5  PLT 250   Cardiac Enzymes: No results for input(s): "CKTOTAL", "CKMB", "CKMBINDEX", "TROPONINI" in the last 168 hours.  BNP (last 3 results) No results for input(s): "BNP" in the last 8760 hours.  ProBNP (last 3 results) No results for input(s): "PROBNP" in the last 8760 hours.  CBG: No results for input(s): "GLUCAP" in the last 168  hours.  Radiological Exams on Admission: MR ABDOMEN MRCP W WO CONTAST  Result Date: 06/13/2022 CLINICAL DATA:  Cholelithiasis RIGHT upper quadrant pain. EXAM: MRI ABDOMEN WITHOUT AND WITH CONTRAST (INCLUDING MRCP) TECHNIQUE: Multiplanar multisequence MR imaging of the abdomen was performed both before and after the administration of intravenous contrast. Heavily T2-weighted images of the biliary and pancreatic ducts were obtained, and three-dimensional MRCP images were rendered by post processing. CONTRAST:  28m GADAVIST GADOBUTROL 1 MMOL/ML IV SOLN COMPARISON:  CT 06/13/2022 FINDINGS: Lower chest:  Lung bases are clear. Hepatobiliary: Multiple gallstones layer within the lumen the gallbladder. There is moderate volume of Peri cholecystic fluid and pericholecystic inflammation (image 21/series 5). There are numerous stones which measure proximally 3 mm each. There is mild intrahepatic biliary duct dilatation. The common hepatic duct measures 7 mm. The common bile duct measures 7 mm. There is a subtle signal void in the distal common bile duct measuring 6 mm on image 12/series 4. No discrete hepatic lesion Pancreas: Normal pancreatic parenchymal intensity. No ductal dilatation or inflammation. Spleen: Normal spleen. Adrenals/urinary tract: Adrenal glands and kidneys are normal. Stomach/Bowel: Stomach and limited of the small bowel is unremarkable Vascular/Lymphatic: Abdominal aortic normal caliber. No retroperitoneal periportal lymphadenopathy. Musculoskeletal: No aggressive osseous lesion IMPRESSION: 1. Signal void in the distal common bile duct just above the ampulla is most consistent with choledocholithiasis. 2. Mild intrahepatic and extrahepatic biliary duct dilatation. 3. Multiple gallstones and pericholecystic fluid consistent with acute cholecystitis. Electronically Signed   By: SSuzy BouchardM.D.   On: 06/13/2022 17:10   MR 3D Recon At Scanner  Result Date: 06/13/2022 CLINICAL DATA:   Cholelithiasis RIGHT upper quadrant pain. EXAM: MRI ABDOMEN WITHOUT AND WITH CONTRAST (INCLUDING MRCP) TECHNIQUE: Multiplanar multisequence MR imaging of the abdomen was performed both before and after the administration of intravenous contrast. Heavily T2-weighted images of the biliary and pancreatic ducts were obtained, and three-dimensional MRCP images were rendered by post processing. CONTRAST:  736mGADAVIST GADOBUTROL 1 MMOL/ML IV SOLN COMPARISON:  CT 06/13/2022 FINDINGS: Lower chest:  Lung bases are clear. Hepatobiliary: Multiple gallstones layer within the lumen the gallbladder. There is moderate volume of Peri cholecystic fluid and pericholecystic inflammation (image 21/series 5). There are numerous stones which measure proximally 3 mm each. There is mild intrahepatic biliary duct dilatation. The common hepatic duct measures 7 mm. The common bile duct measures 7 mm. There is a subtle signal void in the distal common bile duct measuring 6 mm on image 12/series 4. No discrete hepatic lesion Pancreas: Normal pancreatic parenchymal intensity.  No ductal dilatation or inflammation. Spleen: Normal spleen. Adrenals/urinary tract: Adrenal glands and kidneys are normal. Stomach/Bowel: Stomach and limited of the small bowel is unremarkable Vascular/Lymphatic: Abdominal aortic normal caliber. No retroperitoneal periportal lymphadenopathy. Musculoskeletal: No aggressive osseous lesion IMPRESSION: 1. Signal void in the distal common bile duct just above the ampulla is most consistent with choledocholithiasis. 2. Mild intrahepatic and extrahepatic biliary duct dilatation. 3. Multiple gallstones and pericholecystic fluid consistent with acute cholecystitis. Electronically Signed   By: Suzy Bouchard M.D.   On: 06/13/2022 17:10   US Abdomen Limited  Result Date: 06/13/2022 CLINICAL DATA:  RUQ pain EXAM: ULTRASOUND ABDOMEN LIMITED RIGHT UPPER QUADRANT COMPARISON:  None Available. FINDINGS: Gallbladder: Multiple small  layering gallstones with marked thickening of the gallbladder wall. Pericholecystic fluid. Also felt positive sonographic Murphy sign per the technologist. Common bile duct: Diameter: Dilated, measuring 9 mm Liver: No focal lesion identified. Within normal limits in parenchymal echogenicity. Portal vein is patent on color Doppler imaging with normal direction of blood flow towards the liver. Trace perihepatic fluid. IMPRESSION: 1. Findings concerning for acute cholecystitis, detailed above. 2. Dilated common bile duct, raising concern for possible choledocholithiasis. MRCP could further evaluate. Electronically Signed   By: Margaretha Sheffield M.D.   On: 06/13/2022 15:36   DG Chest Portable 1 View  Result Date: 06/13/2022 CLINICAL DATA:  RUQ pain, elevated trop EXAM: PORTABLE CHEST 1 VIEW COMPARISON:  Radiograph 05/31/2017 FINDINGS: Unchanged cardiomediastinal silhouette with prior median sternotomy. No focal airspace consolidation. There is no large pleural effusion. No pneumothorax. IMPRESSION: No evidence of acute cardiopulmonary disease. Electronically Signed   By: Maurine Simmering M.D.   On: 06/13/2022 14:24   CT ABDOMEN PELVIS W CONTRAST  Result Date: 06/13/2022 CLINICAL DATA:  Right upper quadrant pain EXAM: CT ABDOMEN AND PELVIS WITH CONTRAST TECHNIQUE: Multidetector CT imaging of the abdomen and pelvis was performed using the standard protocol following bolus administration of intravenous contrast. RADIATION DOSE REDUCTION: This exam was performed according to the departmental dose-optimization program which includes automated exposure control, adjustment of the mA and/or kV according to patient size and/or use of iterative reconstruction technique. CONTRAST:  136m OMNIPAQUE IOHEXOL 300 MG/ML  SOLN COMPARISON:  None Available. FINDINGS: Lower chest: Bibasilar atelectasis.  Status post median sternotomy. Hepatobiliary: No focal liver lesions are visualized. There is mild intrahepatic biliary ductal  dilatation. There is cholelithiasis and gallbladder sludge with marked gallbladder wall thickening measuring up to 7 mm. There is mucosal hyperenhancement. The common bile duct is also slightly enlarged for age measuring up to 9 mm. No definite radiopaque stone is visualized within the common bile duct. Pancreas: Unremarkable. No pancreatic ductal dilatation or surrounding inflammatory changes. Spleen: Normal in size without focal abnormality. Adrenals/Urinary Tract: Adrenal glands are unremarkable. Kidneys are normal, without renal calculi, focal lesion, or hydronephrosis. Urinary bladder is fluid-filled and slightly distended. Stomach/Bowel: Stomach is within normal limits. Appendix appears normal. No evidence of bowel wall thickening, distention, or inflammatory changes. There is diverticulosis without diverticulitis. Vascular/Lymphatic: No significant vascular findings are present. No enlarged abdominal or pelvic lymph nodes. Reproductive: Status post hysterectomy. No adnexal masses. There is trace fluid in the pelvis. Other: No abdominal wall hernia or abnormality. No abdominopelvic ascites. Musculoskeletal: No acute or significant osseous findings. IMPRESSION: Findings are worrisome for acute cholecystitis. There is also intra and extrahepatic biliary ductal dilatation, which raises the possibility for choledocholithiasis. Electronically Signed   By: HMarin RobertsM.D.   On: 06/13/2022 14:11    EKG: I independently  viewed the EKG done and my findings are as followed: Normal sinus rhythm at a rate of 78 bpm with RBBB  Assessment/Plan Present on Admission:  Choledocholithiasis with acute cholecystitis  CAD (coronary artery disease)  Principal Problem:   Choledocholithiasis with acute cholecystitis Active Problems:   CAD (coronary artery disease)   Abdominal pain   Nausea & vomiting   Hypokalemia   Elevated troponin   Prolonged QT interval  Abdominal pain, nausea and vomiting due to  choledocholithiasis with acute cholecystitis Continue IV NS at 75 mLs/Hr She was started on IV ceftriaxone and IV Flagyl, we shall continue same at this time.  Continue IV morphine 2 mg q.4h p.r.n. for moderate to severe pain Continue IV Zofran p.r.n. N.p.o. at midnight Gastroenterology was consulted and patient will be [facility] to [facility] for possible ERCP per ED PA  Hypokalemia possibly due to vomiting K+ 2.9, this will be replenished  Elevated troponin possibly due to type II demand ischemia Cardiology was consulted and felt that this was due to demand ischemia Echocardiogram in the morning per cardiology recommendation  Prolonged QT interval QTc 556m Avoid QT prolonging drugs K+ 2.9, this will be replenished Magnesium level will be checked Repeat EKG in the morning  CAD s/p STEMI with prior bypass Stable, patient was on aspirin No antihyperlipidemic drug noted in med rec  DVT prophylaxis: SCDs  Code Status: Full code  Consults: Gastroenterology by ED PA  Family Communication: None at bedside  Severity of Illness: The appropriate patient status for this patient is INPATIENT. Inpatient status is judged to be reasonable and necessary in order to provide the required intensity of service to ensure the patient's safety. The patient's presenting symptoms, physical exam findings, and initial radiographic and laboratory data in the context of their chronic comorbidities is felt to place them at high risk for further clinical deterioration. Furthermore, it is not anticipated that the patient will be medically stable for discharge from the hospital within 2 midnights of admission.   * I certify that at the point of admission it is my clinical judgment that the patient will require inpatient hospital care spanning beyond 2 midnights from the point of admission due to high intensity of service, high risk for further deterioration and high frequency of surveillance  required.*  Author: OBernadette Hoit DO 06/13/2022 9:55 PM  For on call review [email or web address]

## 2022-06-13 NOTE — ED Triage Notes (Signed)
Pt with RUQ pain with emesis since yesterday.

## 2022-06-13 NOTE — ED Provider Notes (Signed)
[facility] Provider Note   CSN: [id number] Arrival date & time: 06/13/22  1107     History  Chief Complaint  Patient presents with   Abdominal Pain    [place] is a 81 y.o. female.   Abdominal Pain Associated symptoms: nausea and vomiting   Associated symptoms: no chest pain, no chills, no dysuria, no fever, no hematuria and no shortness of breath         Darielle L Amesquita is a 81 y.o. female with past medical history of coronary artery disease who presents to the Emergency Department complaining of sudden onset nausea vomiting and right upper quadrant pain yesterday.  She states around 3 PM yesterday she developed nausea and vomiting after eating cookies.  She notes gradual onset of pain shortly after.  Pain has been localized to the right upper quadrant since onset.  She endorses decreased appetite.  Pain worsens with attempted food intake.  She denies any flank pain, dysuria, diarrhea, chest pain, shortness of breath fever or chills.  She was seen by general surgery November 2021 for cholelithiasis, she had spontaneous improvement of her symptoms and declined surgical intervention at that time.    Home Medications Prior to Admission medications   Medication Sig Start Date End Date Taking? Authorizing Provider  aspirin EC 81 MG tablet Take 81 mg by mouth daily. Swallow whole.   Yes [provider]  b complex vitamins tablet Take 1 tablet by mouth daily.    Yes [provider]  chlorpheniramine (CHLOR-TRIMETON) 4 MG tablet Take 4 mg by mouth daily.   Yes [provider]  cholecalciferol (VITAMIN D3) 25 MCG (1000 UNIT) tablet Take 1,000 Units by mouth daily.   Yes [provider]  DHA-EPA-Flaxseed Oil-Vitamin E (THERA TEARS NUTRITION PO) Place 1 drop into both eyes daily.   Yes [provider]  melatonin 5 MG TABS Take 5 mg by mouth at bedtime as needed (sleep).   Yes [provider]  QUERCETIN PO  Take 800 mg by mouth daily.    Yes [provider]  cromolyn (NASALCROM) 5.2 MG/ACT nasal spray Place 1 spray into both nostrils 2 (two) times daily.   04/02/20  [provider]      Allergies    Erythromycin    Review of Systems   Review of Systems  Constitutional:  Negative for chills and fever.  Respiratory:  Negative for shortness of breath.   Cardiovascular:  Negative for chest pain.  Gastrointestinal:  Positive for abdominal pain, nausea and vomiting.  Genitourinary:  Negative for dysuria, flank pain and hematuria.  Musculoskeletal:  Negative for back pain.  Neurological:  Negative for weakness.    Physical Exam Updated Vital Signs BP (!) 153/76   Pulse 67   Temp 98.4 F (36.9 C) (Oral)   Resp (!) 22   SpO2 97%  Physical Exam Vitals and nursing note reviewed.  Constitutional:      General: She is not in acute distress.    Appearance: She is not toxic-appearing.  HENT:     Mouth/Throat:     Mouth: Mucous membranes are moist.  Cardiovascular:     Rate and Rhythm: Normal rate and regular rhythm.     Pulses: Normal pulses.  Pulmonary:     Effort: Pulmonary effort is normal.  Abdominal:     Palpations: Abdomen is soft.     Tenderness: There is abdominal tenderness in the right upper quadrant. There is no right  CVA tenderness, left CVA tenderness or guarding.     Comments: Patient has localized tenderness of the right upper quadrant.  No guarding.  Abdomen is soft, no CVA tenderness  Musculoskeletal:        General: Normal range of motion.     Right lower leg: No edema.     Left lower leg: No edema.  Skin:    General: Skin is warm.     Capillary Refill: Capillary refill takes less than 2 seconds.  Neurological:     General: No focal deficit present.     Mental Status: She is alert.     Sensory: No sensory deficit.     Motor: No weakness.     ED Results / Procedures / Treatments   Labs (all labs ordered are listed, but only abnormal results  are displayed) Labs Reviewed  COMPREHENSIVE METABOLIC PANEL - Abnormal; Notable for the following components:      Result Value   Potassium 2.9 (*)    Glucose, Bld 152 (*)    Calcium 8.7 (*)    All other components within normal limits  CBC WITH DIFFERENTIAL/PLATELET - Abnormal; Notable for the following components:   WBC 14.7 (*)    Neutro Abs 12.6 (*)    Monocytes Absolute 1.1 (*)    All other components within normal limits  TROPONIN I (HIGH SENSITIVITY) - Abnormal; Notable for the following components:   Troponin I (High Sensitivity) 122 (*)    All other components within normal limits  LIPASE, BLOOD  URINALYSIS, ROUTINE W REFLEX MICROSCOPIC  TROPONIN I (HIGH SENSITIVITY)    EKG EKG Interpretation  Date/Time:  Wednesday June 13 2022 12:02:06 EDT Ventricular Rate:  70 PR Interval:  127 QRS Duration: 151 QT Interval:  489 QTC Calculation: 528 R Axis:   94 Text Interpretation: 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 EXAM: MRI ABDOMEN WITHOUT AND WITH CONTRAST (INCLUDING MRCP) TECHNIQUE: Multiplanar multisequence MR imaging of the abdomen was performed both before and after the administration of intravenous contrast. Heavily T2-weighted images of the biliary and pancreatic ducts were obtained, and three-dimensional MRCP images were rendered by post processing. CONTRAST:  22m GADAVIST GADOBUTROL 1 MMOL/ML IV SOLN COMPARISON:  CT 06/13/2022 FINDINGS: Lower chest:  Lung bases are clear. Hepatobiliary: Multiple gallstones layer within the lumen the gallbladder. There is moderate volume of Peri cholecystic fluid and pericholecystic inflammation (image 21/series 5). There are numerous stones which measure proximally 3 mm each. There is mild intrahepatic biliary  duct dilatation. The common hepatic duct measures 7 mm. The common bile duct measures 7 mm. There is a subtle signal void in the distal common bile duct measuring 6 mm on image 12/series 4. No discrete hepatic lesion Pancreas: Normal pancreatic parenchymal intensity. No ductal dilatation or inflammation. Spleen: Normal spleen. Adrenals/urinary tract: Adrenal glands and kidneys are normal. Stomach/Bowel: Stomach and limited of the small bowel is unremarkable Vascular/Lymphatic: Abdominal aortic normal caliber. No retroperitoneal periportal lymphadenopathy. Musculoskeletal: No aggressive osseous lesion IMPRESSION: 1. Signal void in the distal common bile duct just above the ampulla is most consistent with choledocholithiasis. 2. Mild intrahepatic and extrahepatic biliary duct dilatation. 3. Multiple gallstones and pericholecystic fluid consistent with acute cholecystitis. Electronically Signed   By: SSuzy BouchardM.D.   On: 06/13/2022 17:10  MR 3D Recon At Scanner  Result Date: 06/13/2022 CLINICAL DATA:  Cholelithiasis RIGHT upper quadrant pain. EXAM: MRI ABDOMEN WITHOUT AND WITH CONTRAST (INCLUDING MRCP) TECHNIQUE: Multiplanar multisequence MR imaging of the abdomen was performed both before and after the administration of intravenous contrast. Heavily T2-weighted images of the biliary and pancreatic ducts were obtained, and three-dimensional MRCP images were rendered by post processing. CONTRAST:  30m GADAVIST GADOBUTROL 1 MMOL/ML IV SOLN COMPARISON:  CT 06/13/2022 FINDINGS: Lower chest:  Lung bases are clear. Hepatobiliary: Multiple gallstones layer within the lumen the gallbladder. There is moderate volume of Peri cholecystic fluid and pericholecystic inflammation (image 21/series 5). There are numerous stones which measure proximally 3 mm each. There is mild intrahepatic biliary duct dilatation. The common hepatic duct measures 7 mm. The common bile duct measures 7 mm. There is a subtle signal void in the  distal common bile duct measuring 6 mm on image 12/series 4. No discrete hepatic lesion Pancreas: Normal pancreatic parenchymal intensity. No ductal dilatation or inflammation. Spleen: Normal spleen. Adrenals/urinary tract: Adrenal glands and kidneys are normal. Stomach/Bowel: Stomach and limited of the small bowel is unremarkable Vascular/Lymphatic: Abdominal aortic normal caliber. No retroperitoneal periportal lymphadenopathy. Musculoskeletal: No aggressive osseous lesion IMPRESSION: 1. Signal void in the distal common bile duct just above the ampulla is most consistent with choledocholithiasis. 2. Mild intrahepatic and extrahepatic biliary duct dilatation. 3. Multiple gallstones and pericholecystic fluid consistent with acute cholecystitis. Electronically Signed   By: SSuzy BouchardM.D.   On: 06/13/2022 17:10   UKoreaAbdomen Limited  Result Date: 06/13/2022 CLINICAL DATA:  RUQ pain EXAM: ULTRASOUND ABDOMEN LIMITED RIGHT UPPER QUADRANT COMPARISON:  None Available. FINDINGS: Gallbladder: Multiple small layering gallstones with marked thickening of the gallbladder wall. Pericholecystic fluid. Also felt positive sonographic Murphy sign per the technologist. Common bile duct: Diameter: Dilated, measuring 9 mm Liver: No focal lesion identified. Within normal limits in parenchymal echogenicity. Portal vein is patent on color Doppler imaging with normal direction of blood flow towards the liver. Trace perihepatic fluid. IMPRESSION: 1. Findings concerning for acute cholecystitis, detailed above. 2. Dilated common bile duct, raising concern for possible choledocholithiasis. MRCP could further evaluate. Electronically Signed   By: FMargaretha SheffieldM.D.   On: 06/13/2022 15:36   DG Chest Portable 1 View  Result Date: 06/13/2022 CLINICAL DATA:  RUQ pain, elevated trop EXAM: PORTABLE CHEST 1 VIEW COMPARISON:  Radiograph 05/31/2017 FINDINGS: Unchanged cardiomediastinal silhouette with prior median sternotomy. No focal  airspace consolidation. There is no large pleural effusion. No pneumothorax. IMPRESSION: No evidence of acute cardiopulmonary disease. Electronically Signed   By: JMaurine SimmeringM.D.   On: 06/13/2022 14:24   CT ABDOMEN PELVIS W CONTRAST  Result Date: 06/13/2022 CLINICAL DATA:  Right upper quadrant pain EXAM: CT ABDOMEN AND PELVIS WITH CONTRAST TECHNIQUE: Multidetector CT imaging of the abdomen and pelvis was performed using the standard protocol following bolus administration of intravenous contrast. RADIATION DOSE REDUCTION: This exam was performed according to the departmental dose-optimization program which includes automated exposure control, adjustment of the mA and/or kV according to patient size and/or use of iterative reconstruction technique. CONTRAST:  1050mOMNIPAQUE IOHEXOL 300 MG/ML  SOLN COMPARISON:  None Available. FINDINGS: Lower chest: Bibasilar atelectasis.  Status post median sternotomy. Hepatobiliary: No focal liver lesions are visualized. There is mild intrahepatic biliary ductal dilatation. There is cholelithiasis and gallbladder sludge with marked gallbladder wall thickening measuring up to 7 mm. There is mucosal hyperenhancement. The common bile duct is also slightly enlarged for  age measuring up to 9 mm. No definite radiopaque stone is visualized within the common bile duct. Pancreas: Unremarkable. No pancreatic ductal dilatation or surrounding inflammatory changes. Spleen: Normal in size without focal abnormality. Adrenals/Urinary Tract: Adrenal glands are unremarkable. Kidneys are normal, without renal calculi, focal lesion, or hydronephrosis. Urinary bladder is fluid-filled and slightly distended. Stomach/Bowel: Stomach is within normal limits. Appendix appears normal. No evidence of bowel wall thickening, distention, or inflammatory changes. There is diverticulosis without diverticulitis. Vascular/Lymphatic: No significant vascular findings are present. No enlarged abdominal or pelvic  lymph nodes. Reproductive: Status post hysterectomy. No adnexal masses. There is trace fluid in the pelvis. Other: No abdominal wall hernia or abnormality. No abdominopelvic ascites. Musculoskeletal: No acute or significant osseous findings. IMPRESSION: Findings are worrisome for acute cholecystitis. There is also intra and extrahepatic biliary ductal dilatation, which raises the possibility for choledocholithiasis. Electronically Signed   By: Marin Roberts M.D.   On: 06/13/2022 14:11    Procedures Procedures    Medications Ordered in ED Medications  cefTRIAXone (ROCEPHIN) 1 g in sodium chloride 0.9 % 100 mL IVPB (has no administration in time range)  metroNIDAZOLE (FLAGYL) IVPB 500 mg (has no administration in time range)  ondansetron (ZOFRAN) injection 4 mg (4 mg Intravenous Given 06/13/22 1237)  morphine (PF) 2 MG/ML injection 2 mg (2 mg Intravenous Given 06/13/22 1237)  iohexol (OMNIPAQUE) 300 MG/ML solution 100 mL (100 mLs Intravenous Contrast Given 06/13/22 1339)  potassium chloride 10 mEq in 100 mL IVPB (0 mEq Intravenous Stopped 06/13/22 1814)  morphine (PF) 2 MG/ML injection 2 mg (2 mg Intravenous Given 06/13/22 1559)  gadobutrol (GADAVIST) 1 MMOL/ML injection 7 mL (7 mLs Intravenous Contrast Given 06/13/22 1655)  0.9 %  sodium chloride infusion ( Intravenous New Bag/Given 06/13/22 1903)    ED Course/ Medical Decision Making/ A&P                           Medical Decision Making Patient here for evaluation of right upper quadrant pain with nausea and vomiting, onset 24 hours ago.  Pain has been localized and nonradiating since onset.  History of known cholelithiasis had consultation with general surgery in 2021 and patient declined surgical intervention at that time  On exam, patient uncomfortable appearing, nontoxic.  Vital signs are reassuring.  No active vomiting during ER stay.  She does have localized tenderness of the right upper quadrant on exam.  I do not appreciate any abdominal  distention or guarding.  Patient's complaint carries high risk of complication and morbidity.  Her differential diagnosis at this time includes but not limited to acute cholecystitis, cholelithiasis, cholangitis and choledocholithiasis   Amount and/or Complexity of Data Reviewed Labs: ordered.    Details: Labs interpreted by me, patient has leukocytosis of 14,000, hemoglobin reassuring.  Chemistries show hypokalemia with potassium of 2.9 this likely secondary to patient's vomiting.  She does not currently take any diuretics.  LFTs unremarkable, lipase also unremarkable.  Labs were initiated by nursing staff, initial troponin of 122, delta troponin essentially unchanged at 121. Radiology: ordered.    Details: CT abdomen and pelvis was ordered for further evaluation of patient's symptoms, findings worrisome for acute cholecystitis.  Intra and extrahepatic biliary duct dilatation noted with concern for choledocholithiasis.  We will proceed with ultrasound. Ultrasound of the right upper quadrantShows findings consistent with acute cholecystitis and dilated common bile duct at approximately 9 mm.  Consulted with general surgery and was recommended to  proceed with MRCP MRCP shows signal voidIn the distal common bile duct consistent with choledocholithiasis and hepatic duct dilatation with multiple gallstones and pericholecystic fluid consistent with acute cholecystitis.  Discussion of management or test interpretation with external provider(s):  Consulted cardiology, Dr. Marlou Porch who will see patient here in the emergency department.  Did not feel there was indication for initiation of heparin as this is likely elevated in the setting of her coronary artery disease and acute gallbladder issue.  Discussed findings with general surgery, Dr. Constance Haw who feels that pt needs ERCP, unfortunately this procedure is not available here at [facility].  I will consult [facility].  Pt will need transfer to [facility] or [facility] for  procedure.    Consulted GI, Dr. Therisa Doyne with [facility] and discussed findings.  She is aware of pt's hospitalist admission and pending transfer.    Discussed with [facility] hospitalist, Dr. Josephine Cables who agrees to admit.    Risk Prescription drug management.   CRITICAL CARE Performed by: Mazal Ebey Total critical care time: 45 minutes Critical care time was exclusive of separately billable procedures and treating other patients. Critical care was necessary to treat or prevent imminent or life-threatening deterioration. Critical care was time spent personally by me on the following activities: development of treatment plan with patient and/or surrogate as well as nursing, discussions with consultants, evaluation of patient's response to treatment, examination of patient, obtaining history from patient or surrogate, ordering and performing treatments and interventions, ordering and review of laboratory studies, ordering and review of radiographic studies, pulse oximetry and re-evaluation of patient's condition.         Final Clinical Impression(s) / ED Diagnoses Final diagnoses:  Choledocholithiasis with acute cholecystitis    Rx / DC Orders ED Discharge Orders     None         Kem Parkinson, PA-C 06/13/22 1959    Fredia Sorrow, MD 06/15/22 [phone]

## 2022-06-13 NOTE — ED Notes (Signed)
Back from CT. Xray at [facility]

## 2022-06-14 ENCOUNTER — Inpatient Hospital Stay (HOSPITAL_COMMUNITY): Payer: Medicare Other

## 2022-06-14 DIAGNOSIS — Z01810 Encounter for preprocedural cardiovascular examination: Secondary | ICD-10-CM

## 2022-06-14 DIAGNOSIS — I255 Ischemic cardiomyopathy: Secondary | ICD-10-CM

## 2022-06-14 DIAGNOSIS — K8042 Calculus of bile duct with acute cholecystitis without obstruction: Secondary | ICD-10-CM | POA: Diagnosis not present

## 2022-06-14 DIAGNOSIS — R9431 Abnormal electrocardiogram [ECG] [EKG]: Secondary | ICD-10-CM

## 2022-06-14 LAB — CBC
HCT: 44.5 % (ref 36.0–46.0)
Hemoglobin: 14.5 g/dL (ref 12.0–15.0)
MCH: 29.8 pg (ref 26.0–34.0)
MCHC: 32.6 g/dL (ref 30.0–36.0)
MCV: 91.4 fL (ref 80.0–100.0)
Platelets: 247 10*3/uL (ref 150–400)
RBC: 4.87 MIL/uL (ref 3.87–5.11)
RDW: 12.7 % (ref 11.5–15.5)
WBC: 14.7 10*3/uL — ABNORMAL HIGH (ref 4.0–10.5)
nRBC: 0 % (ref 0.0–0.2)

## 2022-06-14 LAB — COMPREHENSIVE METABOLIC PANEL
ALT: 19 U/L (ref 0–44)
AST: 25 U/L (ref 15–41)
Albumin: 3.4 g/dL — ABNORMAL LOW (ref 3.5–5.0)
Alkaline Phosphatase: 60 U/L (ref 38–126)
Anion gap: 4 — ABNORMAL LOW (ref 5–15)
BUN: 11 mg/dL (ref 8–23)
CO2: 30 mmol/L (ref 22–32)
Calcium: 8.2 mg/dL — ABNORMAL LOW (ref 8.9–10.3)
Chloride: 105 mmol/L (ref 98–111)
Creatinine, Ser: 0.63 mg/dL (ref 0.44–1.00)
GFR, Estimated: >60 mL/min (ref >60)
Glucose, Bld: 113 mg/dL — ABNORMAL HIGH (ref 70–99)
Potassium: 3.9 mmol/L (ref 3.5–5.1)
Sodium: 139 mmol/L (ref 135–145)
Total Bilirubin: 1.1 mg/dL (ref 0.3–1.2)
Total Protein: 6.1 g/dL — ABNORMAL LOW (ref 6.5–8.1)

## 2022-06-14 LAB — PHOSPHORUS: Phosphorus: 2.6 mg/dL (ref 2.5–4.6)

## 2022-06-14 LAB — ECHOCARDIOGRAM COMPLETE
AR max vel: 2.13 cm2
AV Area VTI: 2.13 cm2
AV Area mean vel: 2.06 cm2
AV Mean grad: 4.5 mmHg
AV Peak grad: 8.4 mmHg
Ao pk vel: 1.45 m/s
Area-P 1/2: 3.48 cm2
Calc EF: 38.4 %
MV M vel: 1.54 m/s
MV Peak grad: 9.5 mmHg
S' Lateral: 3.5 cm
Single Plane A2C EF: 40.3 %
Single Plane A4C EF: 37.2 %

## 2022-06-14 LAB — MAGNESIUM: Magnesium: 2 mg/dL (ref 1.7–2.4)

## 2022-06-14 MED ORDER — PERFLUTREN LIPID MICROSPHERE
1.0000 mL | INTRAVENOUS | Status: AC | PRN
  Administered 2022-06-14: 3 mL via INTRAVENOUS

## 2022-06-14 MED ORDER — SODIUM CHLORIDE 0.9 % IV SOLN: INTRAVENOUS | Status: AC

## 2022-06-14 NOTE — Consult Note (Signed)
Amy Finley 01-Aug-1941  [id number].    Requesting MD: Dr. Arta Silence Chief Complaint/Reason for Consult: Acute Cholecystitis, possible Choledocholithiasis   HPI: Amy Finley is a 80 y.o. female with a hx of CAD s/p CABG for L main dissection in 2011 who presented to the ED for abdominal pain. On Tuesday, 9/19 she began having fatigue, decreased appetite and nausea and vomiting.  On Wednesday, 9/20, she began having intense RUQ abdominal pain after eating cookies causing her to present to the ED for evaluation. Denies fever or chills. Notes history of similar symptoms when she was told she had gallstones in the past and was seen by general surgery.  At that time she had changed her diet with improvement of biliary colic episodes.   Patient noted to have WBC 14.7, normal lipase, normal LFTs.  CT A/P showed cholelithiasis with gallbladder wall thickening measuring up to 7 mm and dilated CBD.  RUQ US showed multiple small layering gallstones with marked thickening of the gallbladder wall, pericholecystic fluid and positive Murphy sign; CBD 9 mm.  Patient was admitted to [facility] and started on IV antibiotics.  GI consulted.  MRCP obtained and showed area in the distal common bile duct just above the ampulla consistent with choledocholithiasis; this also showed multiple gallstones and pericholecystic fluid consistent with acute cholecystitis.  Given scheduling availability and normal LFTs, GI has asked [place] to consider doing lap chole with IOC first before ERCP.  Today patient with continued RUQ abdominal pain but n/v has resolved.  To note patient did have elevated troponin at 122 > 121 on admission.  She denies any chest pain or shortness of breath.  Reports she can walk 2.5 miles without getting any chest pain or shortness of breath.  She has been seen by cardiology who performed a repeat echo during this admission and feels patient can proceed with laparoscopic cholecystectomy.  Patient denies  any daily anticoagulation/antiplatelet use.  History of prior TAH.  Quit smoking in 2011.  No alcohol use.  Retired.  ROS: ROS As above, see hpi  Family History  Problem Relation Age of Onset   Cancer Mother    Diabetes Mother    Congestive Heart Failure Father     Past Medical History:  Diagnosis Date   Cervical cancer ([place])    remote history at age 34   Coronary artery disease    Myocardial infarction ST segment elevation treated with PCI and stenting of her apical LAD complicated by left main dissection requiring emergency CABG x 2 04/07/10   Hyperlipidemia, mild    statin intolerant   Ovarian cancer [facility])     Past Surgical History:  Procedure Laterality Date   CARDIAC CATHETERIZATION  04/07/10     1.5 x 12 Flex balloon angioplasty.  angioplasty performed in the entire distal third ever restoring apical flow.  2.0 x 18 mini Vision stent deployed.  Dissection in proximal LAD extending back into left main and cirumflex.   CATARACT EXTRACTION W/PHACO Left 03/29/2015   Procedure: CATARACT EXTRACTION PHACO AND INTRAOCULAR LENS PLACEMENT;;  Surgeon: Williams Che, MD;  Location: [facility] ORS;  Service: Ophthalmology;  Laterality: Left;  CDE 7.86   CATARACT EXTRACTION W/PHACO Right 08/01/2015   Procedure: CATARACT EXTRACTION PHACO AND INTRAOCULAR LENS PLACEMENT (IOC);  Surgeon: Williams Che, MD;  Location: [facility] ORS;  Service: Ophthalmology;  Laterality: Right;  CDE: 7.96   COLONOSCOPY N/A 03/10/2018   Procedure: COLONOSCOPY;  Surgeon: Rogene Houston, MD;  Location: [facility] ENDO SUITE;  Service: Endoscopy;  Laterality: N/A;  1:25   CORONARY ARTERY BYPASS GRAFT  04/07/10   (emergency)  x2 using saphenous vein graft to mid left anterior descending coronary artery, saphenous vein graft to obtuse marginal branch and left circumflex coronary artery    ORIF WRIST FRACTURE Left 2011   POLYPECTOMY  03/10/2018   Procedure: POLYPECTOMY;  Surgeon: Rogene Houston, MD;  Location: [facility] END[facility] SU[facility];   Service: Endoscopy;;  colon    TOTAL ABDOMINAL HYSTERECTOMY  1976    Social History:  reports that she quit smoking about 12 years ago. Her smoking use included cigarettes. She has a 75.00 pack-year smoking history. She has never used smokeless tobacco. She reports that she does not drink alcohol and does not use drugs.  Allergies:  Allergies  Allergen Reactions   Erythromycin Other (See Comments)    Severe abdominal pain.    Medications Prior to Admission  Medication Sig Dispense Refill   aspirin EC 81 MG tablet Take 81 mg by mouth daily. Swallow whole.     b complex vitamins tablet Take 1 tablet by mouth daily.      chlorpheniramine (CHLOR-TRIMETON) 4 MG tablet Take 4 mg by mouth daily.     cholecalciferol (VITAMIN D3) 25 MCG (1000 UNIT) tablet Take 1,000 Units by mouth daily.     DHA-EPA-Flaxseed Oil-Vitamin E (THERA TEARS NUTRITION PO) Place 1 drop into both eyes daily.     melatonin 5 MG TABS Take 5 mg by mouth at bedtime as needed (sleep).     QUERCETIN PO Take 800 mg by mouth daily.        Physical Exam: Blood pressure (!) 133/56, pulse 85, temperature 98.9 F (37.2 C), temperature source Oral, resp. rate 16, SpO2 93 %. General: pleasant, WD/WN female who is laying in bed in NAD HEENT: head is normocephalic, atraumatic.  Sclera are noninjected.  PERRL.  Ears and nose without any masses or lesions.  Mouth is pink and moist. Dentition fair Heart: regular, rate, and rhythm.  Lungs: CTAB, no wheezes, rhonchi, or rales noted.  Respiratory effort nonlabored Abd: Soft, ND, RUQ ttp with guarding, +BS. No masses, hernias, or organomegaly. Prior hysterectomy scar well-healed MS: no BUE/BLE edema Skin: warm and dry with no masses, lesions, or rashes Psych: A&Ox4 with an appropriate affect Neuro: cranial nerves grossly intact, normal speech, thought process intact, moves all extremities, gait not assessed  Results for orders placed or performed during the hospital encounter of  06/13/22 (from the past 48 hour(s))  Lipase, blood     Status: None   Collection Time: 06/13/22 12:08 PM  Result Value Ref Range   Lipase 26 11 - 51 U/L    Comment: Performed at [facility], [address], [place], [place] [address]  Comprehensive metabolic panel     Status: Abnormal   Collection Time: 06/13/22 12:08 PM  Result Value Ref Range   Sodium 137 135 - 145 mmol/L   Potassium 2.9 (L) 3.5 - 5.1 mmol/L   Chloride 99 98 - 111 mmol/L   CO2 29 22 - 32 mmol/L   Glucose, Bld 152 (H) 70 - 99 mg/dL    Comment: Glucose reference range applies only to samples taken after fasting for at least 8 hours.   BUN 12 8 - 23 mg/dL   Creatinine, Ser 0.63 0.44 - 1.00 mg/dL   Calcium 8.7 (L) 8.9 - 10.3 mg/dL   Total Protein 7.1 6.5 - 8.1 g/dL  Albumin 4.0 3.5 - 5.0 g/dL   AST 29 15 - 41 U/L   ALT 23 0 - 44 U/L   Alkaline Phosphatase 68 38 - 126 U/L   Total Bilirubin 0.9 0.3 - 1.2 mg/dL   GFR, Estimated >60 >60 mL/min    Comment: (NOTE) Calculated using the CKD-EPI Creatinine Equation (2021)    Anion gap 9 5 - 15    Comment: Performed at [facility], [address], [place], [place] [address]  Urinalysis, Routine w reflex microscopic Urine, Clean Catch     Status: Abnormal   Collection Time: 06/13/22 12:08 PM  Result Value Ref Range   Color, Urine STRAW (A) YELLOW   APPearance CLEAR CLEAR   Specific Gravity, Urine 1.025 1.005 - 1.030   pH 7.0 5.0 - 8.0   Glucose, UA NEGATIVE NEGATIVE mg/dL   Hgb urine dipstick MODERATE (A) NEGATIVE   Bilirubin Urine NEGATIVE NEGATIVE   Ketones, ur NEGATIVE NEGATIVE mg/dL   Protein, ur NEGATIVE NEGATIVE mg/dL   Nitrite NEGATIVE NEGATIVE   Leukocytes,Ua NEGATIVE NEGATIVE   RBC / HPF 0-5 0 - 5 RBC/hpf   WBC, UA 0-5 0 - 5 WBC/hpf   Bacteria, UA RARE (A) NONE SEEN    Comment: Performed at [facility], [address], [place], [place] [address]  CBC with Differential     Status: Abnormal   Collection Time: 06/13/22 12:08 PM  Result Value Ref Range    WBC 14.7 (H) 4.0 - 10.5 K/uL   RBC 5.02 3.87 - 5.11 MIL/uL   Hemoglobin 14.9 12.0 - 15.0 g/dL   HCT 43.9 36.0 - 46.0 %   MCV 87.5 80.0 - 100.0 fL   MCH 29.7 26.0 - 34.0 pg   MCHC 33.9 30.0 - 36.0 g/dL   RDW 12.6 11.5 - 15.5 %   Platelets 250 150 - 400 K/uL   nRBC 0.0 0.0 - 0.2 %   Neutrophils Relative % 86 %   Neutro Abs 12.6 (H) 1.7 - 7.7 K/uL   Lymphocytes Relative 7 %   Lymphs Abs 1.0 0.7 - 4.0 K/uL   Monocytes Relative 7 %   Monocytes Absolute 1.1 (H) 0.1 - 1.0 K/uL   Eosinophils Relative 0 %   Eosinophils Absolute 0.0 0.0 - 0.5 K/uL   Basophils Relative 0 %   Basophils Absolute 0.1 0.0 - 0.1 K/uL   Immature Granulocytes 0 %   Abs Immature Granulocytes 0.05 0.00 - 0.07 K/uL    Comment: Performed at [facility], [address], [place], [place] [address]  Troponin I (High Sensitivity)     Status: Abnormal   Collection Time: 06/13/22 12:08 PM  Result Value Ref Range   Troponin I (High Sensitivity) 122 (HH) <18 ng/L    Comment: CRITICAL RESULT CALLED TO, READ BACK BY AND VERIFIED WITH: TAMMY TRIPLET, PA @ 1332 ON 06/13/22 C VARNER (NOTE) Elevated high sensitivity troponin I (hsTnI) values and significant  changes across serial measurements may suggest ACS but many other  chronic and acute conditions are known to elevate hsTnI results.  Refer to the Links section for chest pain algorithms and additional  guidance. Performed at [facility], [address], [place], [place] [address]   Troponin I (High Sensitivity)     Status: Abnormal   Collection Time: 06/13/22  1:55 PM  Result Value Ref Range   Troponin I (High Sensitivity) 121 (HH) <18 ng/L    Comment: CRITICAL VALUE NOTED.  VALUE IS CONSISTENT WITH PREVIOUSLY REPORTED  AND CALLED VALUE. (NOTE) Elevated high sensitivity troponin I (hsTnI) values and significant  changes across serial measurements may suggest ACS but many other  chronic and acute conditions are known to elevate hsTnI results.  Refer to the Links section  for chest pain algorithms and additional  guidance. Performed at [facility], [address] [place], [place] [address]   Comprehensive metabolic panel     Status: Abnormal   Collection Time: 06/14/22  5:01 AM  Result Value Ref Range   Sodium 139 135 - 145 mmol/L   Potassium 3.9 3.5 - 5.1 mmol/L   Chloride 105 98 - 111 mmol/L   CO2 30 22 - 32 mmol/L   Glucose, Bld 113 (H) 70 - 99 mg/dL    Comment: Glucose reference range applies only to samples taken after fasting for at least 8 hours.   BUN 11 8 - 23 mg/dL   Creatinine, Ser 0.63 0.44 - 1.00 mg/dL   Calcium 8.2 (L) 8.9 - 10.3 mg/dL   Total Protein 6.1 (L) 6.5 - 8.1 g/dL   Albumin 3.4 (L) 3.5 - 5.0 g/dL   AST 25 15 - 41 U/L   ALT 19 0 - 44 U/L   Alkaline Phosphatase 60 38 - 126 U/L   Total Bilirubin 1.1 0.3 - 1.2 mg/dL   GFR, Estimated >60 >60 mL/min    Comment: (NOTE) Calculated using the CKD-EPI Creatinine Equation (2021)    Anion gap 4 (L) 5 - 15    Comment: Performed at [facility], [place] [address], [place], [place] [address]  CBC     Status: Abnormal   Collection Time: 06/14/22  5:01 AM  Result Value Ref Range   WBC 14.7 (H) 4.0 - 10.5 K/uL   RBC 4.87 3.87 - 5.11 MIL/uL   Hemoglobin 14.5 12.0 - 15.0 g/dL   HCT 44.5 36.0 - 46.0 %   MCV 91.4 80.0 - 100.0 fL   MCH 29.8 26.0 - 34.0 pg   MCHC 32.6 30.0 - 36.0 g/dL   RDW 12.7 11.5 - 15.5 %   Platelets 247 150 - 400 K/uL   nRBC 0.0 0.0 - 0.2 %    Comment: Performed at [facility], [place] [address], [place], [place] [address]  Magnesium     Status: None   Collection Time: 06/14/22  5:01 AM  Result Value Ref Range   Magnesium 2.0 1.7 - 2.4 mg/dL    Comment: Performed at [facility], [place] [address], [place], [place] [address]  Phosphorus     Status: None   Collection Time: 06/14/22  5:01 AM  Result Value Ref Range   Phosphorus 2.6 2.5 - 4.6 mg/dL    Comment: Performed at [facility], [place]  [address], [place], [place] [address]   MR ABDOMEN MRCP W WO CONTAST  Result Date: 06/13/2022 CLINICAL DATA:  Cholelithiasis RIGHT upper quadrant pain. EXAM: MRI ABDOMEN WITHOUT AND WITH CONTRAST (INCLUDING MRCP) TECHNIQUE: Multiplanar multisequence MR imaging of the abdomen was performed both before and after the administration of intravenous contrast. Heavily T2-weighted images of the biliary and pancreatic ducts were obtained, and three-dimensional MRCP images were rendered by post processing. CONTRAST:  87m GADAVIST GADOBUTROL 1 MMOL/ML IV SOLN COMPARISON:  CT 06/13/2022 FINDINGS: Lower chest:  Lung bases are clear. Hepatobiliary: Multiple gallstones layer within the lumen the gallbladder. There is moderate volume of Peri cholecystic fluid and pericholecystic inflammation (image 21/series 5). There are numerous stones which  measure proximally 3 mm each. There is mild intrahepatic biliary duct dilatation. The common hepatic duct measures 7 mm. The common bile duct measures 7 mm. There is a subtle signal void in the distal common bile duct measuring 6 mm on image 12/series 4. No discrete hepatic lesion Pancreas: Normal pancreatic parenchymal intensity. No ductal dilatation or inflammation. Spleen: Normal spleen. Adrenals/urinary tract: Adrenal glands and kidneys are normal. Stomach/Bowel: Stomach and limited of the small bowel is unremarkable Vascular/Lymphatic: Abdominal aortic normal caliber. No retroperitoneal periportal lymphadenopathy. Musculoskeletal: No aggressive osseous lesion IMPRESSION: 1. Signal void in the distal common bile duct just above the ampulla is most consistent with choledocholithiasis. 2. Mild intrahepatic and extrahepatic biliary duct dilatation. 3. Multiple gallstones and pericholecystic fluid consistent with acute cholecystitis. Electronically Signed   By: Suzy Bouchard M.D.   On: 06/13/2022 17:10   MR 3D Recon At Scanner  Result Date: 06/13/2022 CLINICAL DATA:  Cholelithiasis  RIGHT upper quadrant pain. EXAM: MRI ABDOMEN WITHOUT AND WITH CONTRAST (INCLUDING MRCP) TECHNIQUE: Multiplanar multisequence MR imaging of the abdomen was performed both before and after the administration of intravenous contrast. Heavily T2-weighted images of the biliary and pancreatic ducts were obtained, and three-dimensional MRCP images were rendered by post processing. CONTRAST:  57m GADAVIST GADOBUTROL 1 MMOL/ML IV SOLN COMPARISON:  CT 06/13/2022 FINDINGS: Lower chest:  Lung bases are clear. Hepatobiliary: Multiple gallstones layer within the lumen the gallbladder. There is moderate volume of Peri cholecystic fluid and pericholecystic inflammation (image 21/series 5). There are numerous stones which measure proximally 3 mm each. There is mild intrahepatic biliary duct dilatation. The common hepatic duct measures 7 mm. The common bile duct measures 7 mm. There is a subtle signal void in the distal common bile duct measuring 6 mm on image 12/series 4. No discrete hepatic lesion Pancreas: Normal pancreatic parenchymal intensity. No ductal dilatation or inflammation. Spleen: Normal spleen. Adrenals/urinary tract: Adrenal glands and kidneys are normal. Stomach/Bowel: Stomach and limited of the small bowel is unremarkable Vascular/Lymphatic: Abdominal aortic normal caliber. No retroperitoneal periportal lymphadenopathy. Musculoskeletal: No aggressive osseous lesion IMPRESSION: 1. Signal void in the distal common bile duct just above the ampulla is most consistent with choledocholithiasis. 2. Mild intrahepatic and extrahepatic biliary duct dilatation. 3. Multiple gallstones and pericholecystic fluid consistent with acute cholecystitis. Electronically Signed   By: SSuzy BouchardM.D.   On: 06/13/2022 17:10   [place]oreaAbdomen Limited  Result Date: 06/13/2022 CLINICAL DATA:  RUQ pain EXAM: ULTRASOUND ABDOMEN LIMITED RIGHT UPPER QUADRANT COMPARISON:  None Available. FINDINGS: Gallbladder: Multiple small layering  gallstones with marked thickening of the gallbladder wall. Pericholecystic fluid. Also felt positive sonographic Murphy sign per the technologist. Common bile duct: Diameter: Dilated, measuring 9 mm Liver: No focal lesion identified. Within normal limits in parenchymal echogenicity. Portal vein is patent on color Doppler imaging with normal direction of blood flow towards the liver. Trace perihepatic fluid. IMPRESSION: 1. Findings concerning for acute cholecystitis, detailed above. 2. Dilated common bile duct, raising concern for possible choledocholithiasis. MRCP could further evaluate. Electronically Signed   By: FMargaretha SheffieldM.D.   On: 06/13/2022 15:36   DG Chest Portable 1 View  Result Date: 06/13/2022 CLINICAL DATA:  RUQ pain, elevated trop EXAM: PORTABLE CHEST 1 VIEW COMPARISON:  Radiograph 05/31/2017 FINDINGS: Unchanged cardiomediastinal silhouette with prior median sternotomy. No focal airspace consolidation. There is no large pleural effusion. No pneumothorax. IMPRESSION: No evidence of acute cardiopulmonary disease. Electronically Signed   By: JMaurine SimmeringM.D.   On: 06/13/2022  14:24   CT ABDOMEN PELVIS W CONTRAST  Result Date: 06/13/2022 CLINICAL DATA:  Right upper quadrant pain EXAM: CT ABDOMEN AND PELVIS WITH CONTRAST TECHNIQUE: Multidetector CT imaging of the abdomen and pelvis was performed using the standard protocol following bolus administration of intravenous contrast. RADIATION DOSE REDUCTION: This exam was performed according to the departmental dose-optimization program which includes automated exposure control, adjustment of the mA and/or kV according to patient size and/or use of iterative reconstruction technique. CONTRAST:  151m OMNIPAQUE IOHEXOL 300 MG/ML  SOLN COMPARISON:  None Available. FINDINGS: Lower chest: Bibasilar atelectasis.  Status post median sternotomy. Hepatobiliary: No focal liver lesions are visualized. There is mild intrahepatic biliary ductal dilatation.  There is cholelithiasis and gallbladder sludge with marked gallbladder wall thickening measuring up to 7 mm. There is mucosal hyperenhancement. The common bile duct is also slightly enlarged for age measuring up to 9 mm. No definite radiopaque stone is visualized within the common bile duct. Pancreas: Unremarkable. No pancreatic ductal dilatation or surrounding inflammatory changes. Spleen: Normal in size without focal abnormality. Adrenals/Urinary Tract: Adrenal glands are unremarkable. Kidneys are normal, without renal calculi, focal lesion, or hydronephrosis. Urinary bladder is fluid-filled and slightly distended. Stomach/Bowel: Stomach is within normal limits. Appendix appears normal. No evidence of bowel wall thickening, distention, or inflammatory changes. There is diverticulosis without diverticulitis. Vascular/Lymphatic: No significant vascular findings are present. No enlarged abdominal or pelvic lymph nodes. Reproductive: Status post hysterectomy. No adnexal masses. There is trace fluid in the pelvis. Other: No abdominal wall hernia or abnormality. No abdominopelvic ascites. Musculoskeletal: No acute or significant osseous findings. IMPRESSION: Findings are worrisome for acute cholecystitis. There is also intra and extrahepatic biliary ductal dilatation, which raises the possibility for choledocholithiasis. Electronically Signed   By: HMarin RobertsM.D.   On: 06/13/2022 14:11    Anti-infectives (From admission, onward)    Start     Dose/Rate Route Frequency Ordered Stop   06/14/22 2000  cefTRIAXone (ROCEPHIN) 1 g in sodium chloride 0.9 % 100 mL IVPB        1 g 200 mL/hr over 30 Minutes Intravenous Every 24 hours 06/13/22 2142     06/13/22 2000  cefTRIAXone (ROCEPHIN) 1 g in sodium chloride 0.9 % 100 mL IVPB        1 g 200 mL/hr over 30 Minutes Intravenous  Once 06/13/22 1950 06/13/22 2029   06/13/22 2000  metroNIDAZOLE (FLAGYL) IVPB 500 mg        500 mg 100 mL/hr over 60 Minutes Intravenous  Every 12 hours 06/13/22 1950         Assessment/Plan Acute Cholecystitis Possible Choledocholithiasis  This is a 81year old female with a known history of gallstones who presented with postprandial RUQ abdominal pain, nausea and vomiting.  Work-up concerning for cholecystitis on imaging.  WBC 14.7.  Lipase and LFTs without elevation.  There was also concern for choledocholithiasis on imaging.  GI has evaluated this and obtained MRCP that showed area in the distal common bile duct just above the ampulla consistent with choledocholithiasis. Given scheduling availability and normal LFTs, GI has asked [place] consider doing lap chole with IOC first before ERCP.  Agree work-up is consistent with Acute Cholecystitis.  Recommend continuing IV antibiotics.  Would recommend laparoscopic cholecystectomy w/ IOC during admission.  Patient has already been seen by cardiology and cleared to proceed with laparoscopic cholecystectomy.  Unfortunately patient has already eaten today so this will need to wait until tomorrow.  Okay for clears today, n.p.o.  at midnight.  FEN - CLD, NPO at midnight VTE - SCDs, okay for chemical ppx from a general surgery standpoint ID - On Rocephin/Flagyl (does not need Flagyl from our standpoint)  CAD  I reviewed nursing notes, ED provider notes, Consultant (GI, Cardiology) notes, hospitalist notes, last 24 h vitals and pain scores, last 48 h intake and output, last 24 h labs and trends, and last 24 h imaging results.  Jillyn Ledger, [facility] 06/14/2022, 1:58 PM Please see Amion for pager number during day hours 7:00am-4:30pm

## 2022-06-14 NOTE — Consult Note (Signed)
Referring Provider: Geradine Girt, DO Primary Care Physician:  [name], MD Primary Gastroenterologist:  Althia Forts  Reason for Consultation:  Choledocholithiasis  HPI: Amy Finley is a 81 y.o. female  with medical history significant of coronary artery disease status post stenting and subsequent emergent CABG for left main dissection in 2011, who presented to the ED 06/13/2022 with complaints of abdominal pain, nausea, vomiting.  Also reported generalized weakness and decreased appetite.  She has history of gallstones, had similar symptoms last year which resolved and did not opt to have surgery at that time.  Leukocytosis noted in the ED.  RUQ US showed findings concerning for acute cholecystitis, dilated CBD, raising concern for possible choledocholithiasis.  MRCP showed the following: 1. Signal void in the distal common bile duct just above the ampulla is most consistent with choledocholithiasis. 2. Mild intrahepatic and extrahepatic biliary duct dilatation. 3. Multiple gallstones and pericholecystic fluid consistent with acute cholecystitis.  It was recommended that patient be transferred to [facility] for ERCP.  Patient seen and examined at bedside, her son Corene Cornea is in the room.  Patient reports she had a "gallbladder attack" about a year ago at which time gallstones were visualized.  Surgery was not advised at that time and patient's symptoms resolved.  She has had no issues until this past week.  Patient reports she has noticed looser stools in recent weeks.  States that she went about her normal activities yesterday morning, then had cookies as a snack and within an hour had nausea, vomiting, and RUQ pain without radiation.  The symptoms were similar to what she experienced a year ago.  When symptoms did not improve she came to the ED.  Patient reports RUQ pain is worse with deep inhalation, laughing, and palpation.  She denies any nausea or vomiting today.  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  Denies any MI/stroke since that time.  Denies blood thinner use but reports she is supposed to be taking 81 mg aspirin daily which she does not always remember to do.  Denies alcohol use.  Quit smoking in 2011 following her MI.  Denies NSAID use.  Denies family history of GI malignancy or disease.  She had a colonoscopy in 2019 which revealed a small benign polyp, external hemorrhoids, and diverticulosis.  No prior EGD.  Past Medical History:  Diagnosis Date   Cervical cancer ([place])    remote history at age 89   Coronary artery disease    Myocardial infarction ST segment elevation treated with PCI and stenting of her apical LAD complicated by left main dissection requiring emergency CABG x 2 04/07/10   Hyperlipidemia, mild    statin intolerant   Ovarian cancer [facility])     Past Surgical History:  Procedure Laterality Date   CARDIAC CATHETERIZATION  04/07/10     1.5 x 12 Flex balloon angioplasty.  angioplasty performed in the entire distal third ever restoring apical flow.  2.0 x 18 mini Vision stent deployed.  Dissection in proximal LAD extending back into left main and cirumflex.   CATARACT EXTRACTION W/PHACO Left 03/29/2015   Procedure: CATARACT EXTRACTION PHACO AND INTRAOCULAR LENS PLACEMENT;;  Surgeon: Williams Che, MD;  Location: [facility];  Service: Ophthalmology;  Laterality: Left;  CDE 7.86   CATARACT EXTRACTION W/PHACO Right 08/01/2015   Procedure: CATARACT EXTRACTION PHACO AND INTRAOCULAR LENS PLACEMENT (IOC);  Surgeon: Williams Che, MD;  Location: [facility];  Service: Ophthalmology;  Laterality: Right;  CDE: 7.96  COLONOSCOPY N/A 03/10/2018   Procedure: COLONOSCOPY;  Surgeon: Rogene Houston, MD;  Location: [facility];  Service: Endoscopy;  Laterality: N/A;  1:25   CORONARY ARTERY BYPASS GRAFT  04/07/10   (emergency)  x2 using saphenous vein graft to mid left anterior descending coronary artery, saphenous vein graft to obtuse marginal branch and left circumflex  coronary artery    ORIF WRIST FRACTURE Left 2011   POLYPECTOMY  03/10/2018   Procedure: POLYPECTOMY;  Surgeon: Rogene Houston, MD;  Location: [facility];  Service: Endoscopy;;  colon    TOTAL ABDOMINAL HYSTERECTOMY  1976    Prior to Admission medications   Medication Sig Start Date End Date Taking? Authorizing Provider  aspirin EC 81 MG tablet Take 81 mg by mouth daily. Swallow whole.   Yes [provider]  b complex vitamins tablet Take 1 tablet by mouth daily.    Yes [provider]  chlorpheniramine (CHLOR-TRIMETON) 4 MG tablet Take 4 mg by mouth daily.   Yes [provider]  cholecalciferol (VITAMIN D3) 25 MCG (1000 UNIT) tablet Take 1,000 Units by mouth daily.   Yes [provider]  DHA-EPA-Flaxseed Oil-Vitamin E (THERA TEARS NUTRITION PO) Place 1 drop into both eyes daily.   Yes [provider]  melatonin 5 MG TABS Take 5 mg by mouth at bedtime as needed (sleep).   Yes [provider]  QUERCETIN PO Take 800 mg by mouth daily.    Yes [provider]  cromolyn (NASALCROM) 5.2 MG/ACT nasal spray Place 1 spray into both nostrils 2 (two) times daily.   04/02/20  [provider]    Scheduled Meds: Continuous Infusions:  sodium chloride 75 mL/hr at 06/14/22 0014   cefTRIAXone (ROCEPHIN)  IV     metronidazole 500 mg (06/14/22 0741)   PRN Meds:.acetaminophen **OR** acetaminophen, morphine injection, ondansetron **OR** ondansetron (ZOFRAN) IV, ondansetron (ZOFRAN) IV  Allergies as of 06/13/2022 - Review Complete 06/13/2022  Allergen Reaction Noted   Erythromycin Other (See Comments) 12/10/2012    Family History  Problem Relation Age of Onset   Cancer Mother    Diabetes Mother    Congestive Heart Failure Father     Social History   Socioeconomic History   Marital status: Divorced    Spouse name: Not on file   Number of children: 2   Years of education: Not on file   Highest education level: Not on  file  Occupational History   Not on file  Tobacco Use   Smoking status: Former    Packs/day: 1.50    Years: 50.00    Total pack years: 75.00    Types: Cigarettes    Quit date: 04/07/2010    Years since quitting: 12.1   Smokeless tobacco: Never  Vaping Use   Vaping Use: Never used  Substance and Sexual Activity   Alcohol use: No   Drug use: No   Sexual activity: Not on file  Other Topics Concern   Not on file  Social History Narrative   Not on file   Social Determinants of Health   Financial Resource Strain: Not on file  Food Insecurity: Not on file  Transportation Needs: Not on file  Physical Activity: Not on file  Stress: Not on file  Social Connections: Not on file  Intimate Partner Violence: Not on file    Review of Systems: Review of Systems  Constitutional:  Negative for chills and fever.  HENT:  Negative for sore throat.  Eyes:  Negative for pain and discharge.  Respiratory:  Negative for cough, wheezing and stridor.   Cardiovascular:  Negative for chest pain and palpitations.  Gastrointestinal:  Positive for abdominal pain, diarrhea, nausea and vomiting. Negative for blood in stool, constipation, heartburn and melena.  Genitourinary:  Negative for dysuria and urgency.  Musculoskeletal:  Negative for falls and joint pain.  Skin:  Negative for itching and rash.  Neurological:  Negative for seizures and loss of consciousness.  Endo/Heme/Allergies:  Negative for environmental allergies and polydipsia.  Psychiatric/Behavioral:  Negative for memory loss. The patient does not have insomnia.      Physical Exam: Physical Exam Vitals reviewed.  Constitutional:      General: She is not in acute distress.    Appearance: Normal appearance. She is not toxic-appearing.  HENT:     Head: Normocephalic and atraumatic.     Right Ear: External ear normal.     Left Ear: External ear normal.     Nose: Nose normal.     Mouth/Throat:     Mouth: Mucous membranes are moist.      Pharynx: Oropharynx is clear.  Eyes:     General: No scleral icterus.    Extraocular Movements: Extraocular movements intact.  Cardiovascular:     Rate and Rhythm: Normal rate and regular rhythm.     Pulses: Normal pulses.     Heart sounds: Normal heart sounds.  Pulmonary:     Effort: Pulmonary effort is normal.     Breath sounds: Normal breath sounds.  Abdominal:     General: There is no distension.     Palpations: Abdomen is soft.     Tenderness: There is abdominal tenderness (RUQ/RLQ). There is guarding.  Musculoskeletal:     Cervical back: Normal range of motion and neck supple.     Right lower leg: No edema.     Left lower leg: No edema.  Skin:    General: Skin is warm and dry.     Coloration: Skin is not jaundiced.  Neurological:     General: No focal deficit present.     Mental Status: She is alert and oriented to person, place, and time.  Psychiatric:        Mood and Affect: Mood normal.        Behavior: Behavior normal.     Vital signs: Vitals:   06/14/22 0333 06/14/22 0621  BP: 139/66 (!) 133/56  Pulse: 89 85  Resp: 14 16  Temp: 99.1 F (37.3 C) 98.9 F (37.2 C)  SpO2: 94% 93%   Last BM Date : 06/13/22    GI:  Lab Results: Recent Labs    06/13/22 1208 06/14/22 0501  WBC 14.7* 14.7*  HGB 14.9 14.5  HCT 43.9 44.5  PLT 250 247   BMET Recent Labs    06/13/22 1208 06/14/22 0501  NA 137 139  K 2.9* 3.9  CL 99 105  CO2 29 30  GLUCOSE 152* 113*  BUN 12 11  CREATININE 0.63 0.63  CALCIUM 8.7* 8.2*   LFT Recent Labs    06/14/22 0501  PROT 6.1*  ALBUMIN 3.4*  AST 25  ALT 19  ALKPHOS 60  BILITOT 1.1   PT/INR No results for input(s): "LABPROT", "INR" in the last 72 hours.   Studies/Results: MR ABDOMEN MRCP W WO CONTAST  Result Date: 06/13/2022 CLINICAL DATA:  Cholelithiasis RIGHT upper quadrant pain. EXAM: MRI ABDOMEN WITHOUT AND WITH CONTRAST (INCLUDING MRCP) TECHNIQUE: Multiplanar multisequence MR imaging  of the abdomen was  performed both before and after the administration of intravenous contrast. Heavily T2-weighted images of the biliary and pancreatic ducts were obtained, and three-dimensional MRCP images were rendered by post processing. CONTRAST:  77m GADAVIST GADOBUTROL 1 MMOL/ML IV SOLN COMPARISON:  CT 06/13/2022 FINDINGS: Lower chest:  Lung bases are clear. Hepatobiliary: Multiple gallstones layer within the lumen the gallbladder. There is moderate volume of Peri cholecystic fluid and pericholecystic inflammation (image 21/series 5). There are numerous stones which measure proximally 3 mm each. There is mild intrahepatic biliary duct dilatation. The common hepatic duct measures 7 mm. The common bile duct measures 7 mm. There is a subtle signal void in the distal common bile duct measuring 6 mm on image 12/series 4. No discrete hepatic lesion Pancreas: Normal pancreatic parenchymal intensity. No ductal dilatation or inflammation. Spleen: Normal spleen. Adrenals/urinary tract: Adrenal glands and kidneys are normal. Stomach/Bowel: Stomach and limited of the small bowel is unremarkable Vascular/Lymphatic: Abdominal aortic normal caliber. No retroperitoneal periportal lymphadenopathy. Musculoskeletal: No aggressive osseous lesion IMPRESSION: 1. Signal void in the distal common bile duct just above the ampulla is most consistent with choledocholithiasis. 2. Mild intrahepatic and extrahepatic biliary duct dilatation. 3. Multiple gallstones and pericholecystic fluid consistent with acute cholecystitis. Electronically Signed   By: SSuzy BouchardM.D.   On: 06/13/2022 17:10   MR 3D Recon At Scanner  Result Date: 06/13/2022 CLINICAL DATA:  Cholelithiasis RIGHT upper quadrant pain. EXAM: MRI ABDOMEN WITHOUT AND WITH CONTRAST (INCLUDING MRCP) TECHNIQUE: Multiplanar multisequence MR imaging of the abdomen was performed both before and after the administration of intravenous contrast. Heavily T2-weighted images of the biliary and  pancreatic ducts were obtained, and three-dimensional MRCP images were rendered by post processing. CONTRAST:  769mGADAVIST GADOBUTROL 1 MMOL/ML IV SOLN COMPARISON:  CT 06/13/2022 FINDINGS: Lower chest:  Lung bases are clear. Hepatobiliary: Multiple gallstones layer within the lumen the gallbladder. There is moderate volume of Peri cholecystic fluid and pericholecystic inflammation (image 21/series 5). There are numerous stones which measure proximally 3 mm each. There is mild intrahepatic biliary duct dilatation. The common hepatic duct measures 7 mm. The common bile duct measures 7 mm. There is a subtle signal void in the distal common bile duct measuring 6 mm on image 12/series 4. No discrete hepatic lesion Pancreas: Normal pancreatic parenchymal intensity. No ductal dilatation or inflammation. Spleen: Normal spleen. Adrenals/urinary tract: Adrenal glands and kidneys are normal. Stomach/Bowel: Stomach and limited of the small bowel is unremarkable Vascular/Lymphatic: Abdominal aortic normal caliber. No retroperitoneal periportal lymphadenopathy. Musculoskeletal: No aggressive osseous lesion IMPRESSION: 1. Signal void in the distal common bile duct just above the ampulla is most consistent with choledocholithiasis. 2. Mild intrahepatic and extrahepatic biliary duct dilatation. 3. Multiple gallstones and pericholecystic fluid consistent with acute cholecystitis. Electronically Signed   By: StSuzy Bouchard.D.   On: 06/13/2022 17:10   [place]abdomen Limited  Result Date: 06/13/2022 CLINICAL DATA:  RUQ pain EXAM: ULTRASOUND ABDOMEN LIMITED RIGHT UPPER QUADRANT COMPARISON:  None Available. FINDINGS: Gallbladder: Multiple small layering gallstones with marked thickening of the gallbladder wall. Pericholecystic fluid. Also felt positive sonographic Murphy sign per the technologist. Common bile duct: Diameter: Dilated, measuring 9 mm Liver: No focal lesion identified. Within normal limits in parenchymal echogenicity.  Portal vein is patent on color Doppler imaging with normal direction of blood flow towards the liver. Trace perihepatic fluid. IMPRESSION: 1. Findings concerning for acute cholecystitis, detailed above. 2. Dilated common bile duct, raising concern for possible choledocholithiasis. MRCP  could further evaluate. Electronically Signed   By: Margaretha Sheffield M.D.   On: 06/13/2022 15:36   DG Chest Portable 1 View  Result Date: 06/13/2022 CLINICAL DATA:  RUQ pain, elevated trop EXAM: PORTABLE CHEST 1 VIEW COMPARISON:  Radiograph 05/31/2017 FINDINGS: Unchanged cardiomediastinal silhouette with prior median sternotomy. No focal airspace consolidation. There is no large pleural effusion. No pneumothorax. IMPRESSION: No evidence of acute cardiopulmonary disease. Electronically Signed   By: Maurine Simmering M.D.   On: 06/13/2022 14:24   CT ABDOMEN PELVIS W CONTRAST  Result Date: 06/13/2022 CLINICAL DATA:  Right upper quadrant pain EXAM: CT ABDOMEN AND PELVIS WITH CONTRAST TECHNIQUE: Multidetector CT imaging of the abdomen and pelvis was performed using the standard protocol following bolus administration of intravenous contrast. RADIATION DOSE REDUCTION: This exam was performed according to the departmental dose-optimization program which includes automated exposure control, adjustment of the mA and/or kV according to patient size and/or use of iterative reconstruction technique. CONTRAST:  175m OMNIPAQUE IOHEXOL 300 MG/ML  SOLN COMPARISON:  None Available. FINDINGS: Lower chest: Bibasilar atelectasis.  Status post median sternotomy. Hepatobiliary: No focal liver lesions are visualized. There is mild intrahepatic biliary ductal dilatation. There is cholelithiasis and gallbladder sludge with marked gallbladder wall thickening measuring up to 7 mm. There is mucosal hyperenhancement. The common bile duct is also slightly enlarged for age measuring up to 9 mm. No definite radiopaque stone is visualized within the common bile  duct. Pancreas: Unremarkable. No pancreatic ductal dilatation or surrounding inflammatory changes. Spleen: Normal in size without focal abnormality. Adrenals/Urinary Tract: Adrenal glands are unremarkable. Kidneys are normal, without renal calculi, focal lesion, or hydronephrosis. Urinary bladder is fluid-filled and slightly distended. Stomach/Bowel: Stomach is within normal limits. Appendix appears normal. No evidence of bowel wall thickening, distention, or inflammatory changes. There is diverticulosis without diverticulitis. Vascular/Lymphatic: No significant vascular findings are present. No enlarged abdominal or pelvic lymph nodes. Reproductive: Status post hysterectomy. No adnexal masses. There is trace fluid in the pelvis. Other: No abdominal wall hernia or abnormality. No abdominopelvic ascites. Musculoskeletal: No acute or significant osseous findings. IMPRESSION: Findings are worrisome for acute cholecystitis. There is also intra and extrahepatic biliary ductal dilatation, which raises the possibility for choledocholithiasis. Electronically Signed   By: HMarin RobertsM.D.   On: 06/13/2022 14:11    Impression: Choledocholithiasis with acute cholecystitis - RUQ [place]9/20/2023 1. Findings concerning for acute cholecystitis, detailed above. 2. Dilated common bile duct, raising concern for possible choledocholithiasis. MRCP could further evaluate. - MRCP 06/13/2022 1. Signal void in the distal common bile duct just above the ampulla is most consistent with choledocholithiasis. 2. Mild intrahepatic and extrahepatic biliary duct dilatation. 3. Multiple gallstones and pericholecystic fluid consistent with acute cholecystitis.  - Labs 06/14/2022 showed leukocytosis with WBC 14.7, hemoglobin 14.5, platelets 247, normal LFTs and total bilirubin.  -Elevated troponin, cardiology consulted and felt that this was due to demand ischemia, should be getting an echo today.  Plan: Discussed ERCP with patient and  son. Will discuss procedure timing with Dr. OPaulita Fujitaand GI team. Patient is stable at this time.  Continue to trend LFTs. Continue antibiotics, pain control, and antiemetics as needed. Clear liquid diet as tolerated.  [facility] will follow.    LOS: 1 day   SAngelique Holm PA-C 06/14/2022, 8:23 AM  Contact #  [phone]

## 2022-06-14 NOTE — Progress Notes (Signed)
PROGRESS NOTE    Amy Finley  IRC:[id number] DOB: 25-Jul-1941 DOA: 06/13/2022 PCP: Amy Noble, MD    Brief Narrative:  Amy Finley is a 81 y.o. female with medical history significant of coronary artery disease status post stenting and subsequent emergent CABG for left main dissection in 2011 who presents to the emergency department due to abdominal pain which started this morning.  Patient complained of nausea and vomiting which started yesterday, she states that she had a chocolate cookie in the afternoon yesterday and shortly after this, she felt sick to her stomach and vomited, she had other episodes of vomiting after trying to eat, this was followed by generalized weakness and decreased appetite.  On waking up this morning, she developed abdominal pain in the right upper quadrant which was rated as 6/10 on pain scale.  She thought it was due to gallstones since she had similar symptoms last year that subsequently resolved and did not do any surgery at that time.    Assessment and Plan: Abdominal pain, nausea and vomiting due to choledocholithiasis with acute cholecystitis -IV abx -IVF Continue IV morphine 2 mg q.4h p.r.n. for moderate to severe pain Continue IV Zofran p.r.n. -ERCP when time available   Hypokalemia possibly due to vomiting -replete   Elevated troponin possibly due to type II demand ischemia Cardiology was consulted and felt that this was due to demand ischemia -echo pending   Prolonged QT interval QTc 537m -repeat EKG   CAD s/p STEMI with prior bypass Stable, patient was on aspirin No antihyperlipidemic drug noted in med rec     DVT prophylaxis: SCDs Start: 06/13/22 2347    Code Status: Full Code   Disposition Plan:  Level of care: Med-Surg Status is: Inpatient Remains inpatient appropriate because: needs ERCP    Consultants:  GI   Subjective: Pain in RUQ with movement and deep breathing  Objective: Vitals:   06/14/22 0058 06/14/22  0215 06/14/22 0333 06/14/22 0621  BP: (!) 152/62 (!) 143/66 139/66 (!) 133/56  Pulse: 77 75 89 85  Resp: '18 18 14 16  '$ Temp: 98.7 F (37.1 C) 98.8 F (37.1 C) 99.1 F (37.3 C) 98.9 F (37.2 C)  TempSrc: Oral Oral Oral Oral  SpO2: 96% 96% 94% 93%    Intake/Output Summary (Last 24 hours) at 06/14/2022 1041 Last data filed at 06/14/2022 1000 Gross per 24 hour  Intake 1259.7 ml  Output 400 ml  Net 859.7 ml   There were no vitals filed for this visit.  Examination:   General: Appearance:     Overweight female in no acute distress     Lungs:      respirations unlabored  Heart:    Normal heart rate. Normal rhythm. No murmurs, rubs, or gallops.    MS:   All extremities are intact.    Neurologic:   Awake, alert       Data Reviewed: I have personally reviewed following labs and imaging studies  CBC: Recent Labs  Lab 06/13/22 1208 06/14/22 0501  WBC 14.7* 14.7*  NEUTROABS 12.6*  --   HGB 14.9 14.5  HCT 43.9 44.5  MCV 87.5 91.4  PLT 250 2510  Basic Metabolic Panel: Recent Labs  Lab 06/13/22 1208 06/14/22 0501  NA 137 139  K 2.9* 3.9  CL 99 105  CO2 29 30  GLUCOSE 152* 113*  BUN 12 11  CREATININE 0.63 0.63  CALCIUM 8.7* 8.2*  MG  --  2.0  PHOS  --  2.6   GFR: CrCl cannot be calculated (Unknown ideal weight.). Liver Function Tests: Recent Labs  Lab 06/13/22 1208 06/14/22 0501  AST 29 25  ALT 23 19  ALKPHOS 68 60  BILITOT 0.9 1.1  PROT 7.1 6.1*  ALBUMIN 4.0 3.4*   Recent Labs  Lab 06/13/22 1208  LIPASE 26   No results for input(s): "AMMONIA" in the last 168 hours. Coagulation Profile: No results for input(s): "INR", "PROTIME" in the last 168 hours. Cardiac Enzymes: No results for input(s): "CKTOTAL", "CKMB", "CKMBINDEX", "TROPONINI" in the last 168 hours. BNP (last 3 results) No results for input(s): "PROBNP" in the last 8760 hours. HbA1C: No results for input(s): "HGBA1C" in the last 72 hours. CBG: No results for input(s): "GLUCAP" in  the last 168 hours. Lipid Profile: No results for input(s): "CHOL", "HDL", "LDLCALC", "TRIG", "CHOLHDL", "LDLDIRECT" in the last 72 hours. Thyroid Function Tests: No results for input(s): "TSH", "T4TOTAL", "FREET4", "T3FREE", "THYROIDAB" in the last 72 hours. Anemia Panel: No results for input(s): "VITAMINB12", "FOLATE", "FERRITIN", "TIBC", "IRON", "RETICCTPCT" in the last 72 hours. Sepsis Labs: No results for input(s): "PROCALCITON", "LATICACIDVEN" in the last 168 hours.  No results found for this or any previous visit (from the past 240 hour(s)).       Radiology Studies: MR ABDOMEN MRCP W WO CONTAST  Result Date: 06/13/2022 CLINICAL DATA:  Cholelithiasis RIGHT upper quadrant pain. EXAM: MRI ABDOMEN WITHOUT AND WITH CONTRAST (INCLUDING MRCP) TECHNIQUE: Multiplanar multisequence MR imaging of the abdomen was performed both before and after the administration of intravenous contrast. Heavily T2-weighted images of the biliary and pancreatic ducts were obtained, and three-dimensional MRCP images were rendered by post processing. CONTRAST:  37m GADAVIST GADOBUTROL 1 MMOL/ML IV SOLN COMPARISON:  CT 06/13/2022 FINDINGS: Lower chest:  Lung bases are clear. Hepatobiliary: Multiple gallstones layer within the lumen the gallbladder. There is moderate volume of Peri cholecystic fluid and pericholecystic inflammation (image 21/series 5). There are numerous stones which measure proximally 3 mm each. There is mild intrahepatic biliary duct dilatation. The common hepatic duct measures 7 mm. The common bile duct measures 7 mm. There is a subtle signal void in the distal common bile duct measuring 6 mm on image 12/series 4. No discrete hepatic lesion Pancreas: Normal pancreatic parenchymal intensity. No ductal dilatation or inflammation. Spleen: Normal spleen. Adrenals/urinary tract: Adrenal glands and kidneys are normal. Stomach/Bowel: Stomach and limited of the small bowel is unremarkable Vascular/Lymphatic:  Abdominal aortic normal caliber. No retroperitoneal periportal lymphadenopathy. Musculoskeletal: No aggressive osseous lesion IMPRESSION: 1. Signal void in the distal common bile duct just above the ampulla is most consistent with choledocholithiasis. 2. Mild intrahepatic and extrahepatic biliary duct dilatation. 3. Multiple gallstones and pericholecystic fluid consistent with acute cholecystitis. Electronically Signed   By: SSuzy BouchardM.D.   On: 06/13/2022 17:10   MR 3D Recon At Scanner  Result Date: 06/13/2022 CLINICAL DATA:  Cholelithiasis RIGHT upper quadrant pain. EXAM: MRI ABDOMEN WITHOUT AND WITH CONTRAST (INCLUDING MRCP) TECHNIQUE: Multiplanar multisequence MR imaging of the abdomen was performed both before and after the administration of intravenous contrast. Heavily T2-weighted images of the biliary and pancreatic ducts were obtained, and three-dimensional MRCP images were rendered by post processing. CONTRAST:  734mGADAVIST GADOBUTROL 1 MMOL/ML IV SOLN COMPARISON:  CT 06/13/2022 FINDINGS: Lower chest:  Lung bases are clear. Hepatobiliary: Multiple gallstones layer within the lumen the gallbladder. There is moderate volume of Peri cholecystic fluid and pericholecystic inflammation (image 21/series 5). There are numerous stones which measure proximally  3 mm each. There is mild intrahepatic biliary duct dilatation. The common hepatic duct measures 7 mm. The common bile duct measures 7 mm. There is a subtle signal void in the distal common bile duct measuring 6 mm on image 12/series 4. No discrete hepatic lesion Pancreas: Normal pancreatic parenchymal intensity. No ductal dilatation or inflammation. Spleen: Normal spleen. Adrenals/urinary tract: Adrenal glands and kidneys are normal. Stomach/Bowel: Stomach and limited of the small bowel is unremarkable Vascular/Lymphatic: Abdominal aortic normal caliber. No retroperitoneal periportal lymphadenopathy. Musculoskeletal: No aggressive osseous lesion  IMPRESSION: 1. Signal void in the distal common bile duct just above the ampulla is most consistent with choledocholithiasis. 2. Mild intrahepatic and extrahepatic biliary duct dilatation. 3. Multiple gallstones and pericholecystic fluid consistent with acute cholecystitis. Electronically Signed   By: Suzy Bouchard M.D.   On: 06/13/2022 17:10   US Abdomen Limited  Result Date: 06/13/2022 CLINICAL DATA:  RUQ pain EXAM: ULTRASOUND ABDOMEN LIMITED RIGHT UPPER QUADRANT COMPARISON:  None Available. FINDINGS: Gallbladder: Multiple small layering gallstones with marked thickening of the gallbladder wall. Pericholecystic fluid. Also felt positive sonographic Murphy sign per the technologist. Common bile duct: Diameter: Dilated, measuring 9 mm Liver: No focal lesion identified. Within normal limits in parenchymal echogenicity. Portal vein is patent on color Doppler imaging with normal direction of blood flow towards the liver. Trace perihepatic fluid. IMPRESSION: 1. Findings concerning for acute cholecystitis, detailed above. 2. Dilated common bile duct, raising concern for possible choledocholithiasis. MRCP could further evaluate. Electronically Signed   By: Margaretha Sheffield M.D.   On: 06/13/2022 15:36   DG Chest Portable 1 View  Result Date: 06/13/2022 CLINICAL DATA:  RUQ pain, elevated trop EXAM: PORTABLE CHEST 1 VIEW COMPARISON:  Radiograph 05/31/2017 FINDINGS: Unchanged cardiomediastinal silhouette with prior median sternotomy. No focal airspace consolidation. There is no large pleural effusion. No pneumothorax. IMPRESSION: No evidence of acute cardiopulmonary disease. Electronically Signed   By: Maurine Simmering M.D.   On: 06/13/2022 14:24   CT ABDOMEN PELVIS W CONTRAST  Result Date: 06/13/2022 CLINICAL DATA:  Right upper quadrant pain EXAM: CT ABDOMEN AND PELVIS WITH CONTRAST TECHNIQUE: Multidetector CT imaging of the abdomen and pelvis was performed using the standard protocol following bolus  administration of intravenous contrast. RADIATION DOSE REDUCTION: This exam was performed according to the departmental dose-optimization program which includes automated exposure control, adjustment of the mA and/or kV according to patient size and/or use of iterative reconstruction technique. CONTRAST:  137m OMNIPAQUE IOHEXOL 300 MG/ML  SOLN COMPARISON:  None Available. FINDINGS: Lower chest: Bibasilar atelectasis.  Status post median sternotomy. Hepatobiliary: No focal liver lesions are visualized. There is mild intrahepatic biliary ductal dilatation. There is cholelithiasis and gallbladder sludge with marked gallbladder wall thickening measuring up to 7 mm. There is mucosal hyperenhancement. The common bile duct is also slightly enlarged for age measuring up to 9 mm. No definite radiopaque stone is visualized within the common bile duct. Pancreas: Unremarkable. No pancreatic ductal dilatation or surrounding inflammatory changes. Spleen: Normal in size without focal abnormality. Adrenals/Urinary Tract: Adrenal glands are unremarkable. Kidneys are normal, without renal calculi, focal lesion, or hydronephrosis. Urinary bladder is fluid-filled and slightly distended. Stomach/Bowel: Stomach is within normal limits. Appendix appears normal. No evidence of bowel wall thickening, distention, or inflammatory changes. There is diverticulosis without diverticulitis. Vascular/Lymphatic: No significant vascular findings are present. No enlarged abdominal or pelvic lymph nodes. Reproductive: Status post hysterectomy. No adnexal masses. There is trace fluid in the pelvis. Other: No abdominal wall hernia or abnormality.  No abdominopelvic ascites. Musculoskeletal: No acute or significant osseous findings. IMPRESSION: Findings are worrisome for acute cholecystitis. There is also intra and extrahepatic biliary ductal dilatation, which raises the possibility for choledocholithiasis. Electronically Signed   By: Marin Roberts M.D.    On: 06/13/2022 14:11        Scheduled Meds:  Continuous Infusions:  sodium chloride 75 mL/hr at 06/14/22 0014   cefTRIAXone (ROCEPHIN)  IV     metronidazole 500 mg (06/14/22 0741)     LOS: 1 day    Time spent: 45 minutes spent on chart review, discussion with nursing staff, consultants, updating family and interview/physical exam; more than 50% of that time was spent in counseling and/or coordination of care.    Geradine Girt, DO [facility] Available via Epic secure chat 7am-7pm After these hours, please refer to coverage provider listed on [email or web address] 06/14/2022, 10:41 AM

## 2022-06-14 NOTE — Progress Notes (Signed)
  Echocardiogram 2D Echocardiogram has been performed.  Lana Fish 06/14/2022, 12:53 PM

## 2022-06-14 NOTE — Progress Notes (Addendum)
Rounding Note    Patient Name: Amy Finley Date of Encounter: 06/14/2022  [place] Cardiologist: None   Subjective   Amy Finley denies CP/SOB  Inpatient Medications    Scheduled Meds:  Continuous Infusions:  cefTRIAXone (ROCEPHIN)  IV     metronidazole 500 mg (06/14/22 0741)   PRN Meds: acetaminophen **OR** acetaminophen, morphine injection, ondansetron **OR** ondansetron (ZOFRAN) IV, ondansetron (ZOFRAN) IV, perflutren lipid microspheres (DEFINITY) IV suspension   Vital Signs    Vitals:   06/14/22 0058 06/14/22 0215 06/14/22 0333 06/14/22 0621  BP: (!) 152/62 (!) 143/66 139/66 (!) 133/56  Pulse: 77 75 89 85  Resp: '18 18 14 16  '$ Temp: 98.7 F (37.1 C) 98.8 F (37.1 C) 99.1 F (37.3 C) 98.9 F (37.2 C)  TempSrc: Oral Oral Oral Oral  SpO2: 96% 96% 94% 93%    Intake/Output Summary (Last 24 hours) at 06/14/2022 1305 Last data filed at 06/14/2022 1000 Gross per 24 hour  Intake 1259.7 ml  Output 400 ml  Net 859.7 ml      08/02/2020    3:31 PM 07/01/2020    9:23 AM 04/02/2020   12:24 PM  Last 3 Weights  Weight (lbs) 150 lb 155 lb 152 lb  Weight (kg) 68.04 kg 70.308 kg 68.947 kg      Telemetry    NSR - Personally Reviewed  ECG    NSR, PVCs, RBBB - Personally Reviewed  Physical Exam   Vitals:   06/14/22 0333 06/14/22 0621  BP: 139/66 (!) 133/56  Pulse: 89 85  Resp: 14 16  Temp: 99.1 F (37.3 C) 98.9 F (37.2 C)  SpO2: 94% 93%    GEN: No acute distress.   Neck: No JVD Cardiac: RRR, no murmurs, rubs, or gallops.  Respiratory: Clear to auscultation bilaterally. GI: Soft, nontender, non-distended  Abd: non distended, RUQ TTP MS: No edema; No deformity. Neuro:  Nonfocal  Psych: Normal affect   Labs    High Sensitivity Troponin:   Recent Labs  Lab 06/13/22 1208 06/13/22 1355  TROPONINIHS 122* 121*     Chemistry Recent Labs  Lab 06/13/22 1208 06/14/22 0501  NA 137 139  K 2.9* 3.9  CL 99 105  CO2 29 30  GLUCOSE 152*  113*  BUN 12 11  CREATININE 0.63 0.63  CALCIUM 8.7* 8.2*  MG  --  2.0  PROT 7.1 6.1*  ALBUMIN 4.0 3.4*  AST 29 25  ALT 23 19  ALKPHOS 68 60  BILITOT 0.9 1.1  GFRNONAA >60 >60  ANIONGAP 9 4*    Lipids No results for input(s): "CHOL", "TRIG", "HDL", "LABVLDL", "LDLCALC", "CHOLHDL" in the last 168 hours.  Hematology Recent Labs  Lab 06/13/22 1208 06/14/22 0501  WBC 14.7* 14.7*  RBC 5.02 4.87  HGB 14.9 14.5  HCT 43.9 44.5  MCV 87.5 91.4  MCH 29.7 29.8  MCHC 33.9 32.6  RDW 12.6 12.7  PLT 250 247   Thyroid No results for input(s): "TSH", "FREET4" in the last 168 hours.  BNPNo results for input(s): "BNP", "PROBNP" in the last 168 hours.  DDimer No results for input(s): "DDIMER" in the last 168 hours.   Radiology    MR ABDOMEN MRCP W WO CONTAST  Result Date: 06/13/2022 CLINICAL DATA:  Cholelithiasis RIGHT upper quadrant pain. EXAM: MRI ABDOMEN WITHOUT AND WITH CONTRAST (INCLUDING MRCP) TECHNIQUE: Multiplanar multisequence MR imaging of the abdomen was performed both before and after the administration of intravenous contrast. Heavily T2-weighted images of  the biliary and pancreatic ducts were obtained, and three-dimensional MRCP images were rendered by post processing. CONTRAST:  9m GADAVIST GADOBUTROL 1 MMOL/ML IV SOLN COMPARISON:  CT 06/13/2022 FINDINGS: Lower chest:  Lung bases are clear. Hepatobiliary: Multiple gallstones layer within the lumen the gallbladder. There is moderate volume of Peri cholecystic fluid and pericholecystic inflammation (image 21/series 5). There are numerous stones which measure proximally 3 mm each. There is mild intrahepatic biliary duct dilatation. The common hepatic duct measures 7 mm. The common bile duct measures 7 mm. There is a subtle signal void in the distal common bile duct measuring 6 mm on image 12/series 4. No discrete hepatic lesion Pancreas: Normal pancreatic parenchymal intensity. No ductal dilatation or inflammation. Spleen: Normal spleen.  Adrenals/urinary tract: Adrenal glands and kidneys are normal. Stomach/Bowel: Stomach and limited of the small bowel is unremarkable Vascular/Lymphatic: Abdominal aortic normal caliber. No retroperitoneal periportal lymphadenopathy. Musculoskeletal: No aggressive osseous lesion IMPRESSION: 1. Signal void in the distal common bile duct just above the ampulla is most consistent with choledocholithiasis. 2. Mild intrahepatic and extrahepatic biliary duct dilatation. 3. Multiple gallstones and pericholecystic fluid consistent with acute cholecystitis. Electronically Signed   By: SSuzy BouchardM.D.   On: 06/13/2022 17:10   MR 3D Recon At Scanner  Result Date: 06/13/2022 CLINICAL DATA:  Cholelithiasis RIGHT upper quadrant pain. EXAM: MRI ABDOMEN WITHOUT AND WITH CONTRAST (INCLUDING MRCP) TECHNIQUE: Multiplanar multisequence MR imaging of the abdomen was performed both before and after the administration of intravenous contrast. Heavily T2-weighted images of the biliary and pancreatic ducts were obtained, and three-dimensional MRCP images were rendered by post processing. CONTRAST:  711mGADAVIST GADOBUTROL 1 MMOL/ML IV SOLN COMPARISON:  CT 06/13/2022 FINDINGS: Lower chest:  Lung bases are clear. Hepatobiliary: Multiple gallstones layer within the lumen the gallbladder. There is moderate volume of Peri cholecystic fluid and pericholecystic inflammation (image 21/series 5). There are numerous stones which measure proximally 3 mm each. There is mild intrahepatic biliary duct dilatation. The common hepatic duct measures 7 mm. The common bile duct measures 7 mm. There is a subtle signal void in the distal common bile duct measuring 6 mm on image 12/series 4. No discrete hepatic lesion Pancreas: Normal pancreatic parenchymal intensity. No ductal dilatation or inflammation. Spleen: Normal spleen. Adrenals/urinary tract: Adrenal glands and kidneys are normal. Stomach/Bowel: Stomach and limited of the small bowel is  unremarkable Vascular/Lymphatic: Abdominal aortic normal caliber. No retroperitoneal periportal lymphadenopathy. Musculoskeletal: No aggressive osseous lesion IMPRESSION: 1. Signal void in the distal common bile duct just above the ampulla is most consistent with choledocholithiasis. 2. Mild intrahepatic and extrahepatic biliary duct dilatation. 3. Multiple gallstones and pericholecystic fluid consistent with acute cholecystitis. Electronically Signed   By: StSuzy Bouchard.D.   On: 06/13/2022 17:10   USKoreabdomen Limited  Result Date: 06/13/2022 CLINICAL DATA:  RUQ pain EXAM: ULTRASOUND ABDOMEN LIMITED RIGHT UPPER QUADRANT COMPARISON:  None Available. FINDINGS: Gallbladder: Multiple small layering gallstones with marked thickening of the gallbladder wall. Pericholecystic fluid. Also felt positive sonographic Murphy sign per the technologist. Common bile duct: Diameter: Dilated, measuring 9 mm Liver: No focal lesion identified. Within normal limits in parenchymal echogenicity. Portal vein is patent on color Doppler imaging with normal direction of blood flow towards the liver. Trace perihepatic fluid. IMPRESSION: 1. Findings concerning for acute cholecystitis, detailed above. 2. Dilated common bile duct, raising concern for possible choledocholithiasis. MRCP could further evaluate. Electronically Signed   By: FrMargaretha Sheffield.D.   On: 06/13/2022 15:36  DG Chest Portable 1 View  Result Date: 06/13/2022 CLINICAL DATA:  RUQ pain, elevated trop EXAM: PORTABLE CHEST 1 VIEW COMPARISON:  Radiograph 05/31/2017 FINDINGS: Unchanged cardiomediastinal silhouette with prior median sternotomy. No focal airspace consolidation. There is no large pleural effusion. No pneumothorax. IMPRESSION: No evidence of acute cardiopulmonary disease. Electronically Signed   By: Maurine Simmering M.D.   On: 06/13/2022 14:24   CT ABDOMEN PELVIS W CONTRAST  Result Date: 06/13/2022 CLINICAL DATA:  Right upper quadrant pain EXAM: CT ABDOMEN  AND PELVIS WITH CONTRAST TECHNIQUE: Multidetector CT imaging of the abdomen and pelvis was performed using the standard protocol following bolus administration of intravenous contrast. RADIATION DOSE REDUCTION: This exam was performed according to the departmental dose-optimization program which includes automated exposure control, adjustment of the mA and/or kV according to patient size and/or use of iterative reconstruction technique. CONTRAST:  157m OMNIPAQUE IOHEXOL 300 MG/ML  SOLN COMPARISON:  None Available. FINDINGS: Lower chest: Bibasilar atelectasis.  Status post median sternotomy. Hepatobiliary: No focal liver lesions are visualized. There is mild intrahepatic biliary ductal dilatation. There is cholelithiasis and gallbladder sludge with marked gallbladder wall thickening measuring up to 7 mm. There is mucosal hyperenhancement. The common bile duct is also slightly enlarged for age measuring up to 9 mm. No definite radiopaque stone is visualized within the common bile duct. Pancreas: Unremarkable. No pancreatic ductal dilatation or surrounding inflammatory changes. Spleen: Normal in size without focal abnormality. Adrenals/Urinary Tract: Adrenal glands are unremarkable. Kidneys are normal, without renal calculi, focal lesion, or hydronephrosis. Urinary bladder is fluid-filled and slightly distended. Stomach/Bowel: Stomach is within normal limits. Appendix appears normal. No evidence of bowel wall thickening, distention, or inflammatory changes. There is diverticulosis without diverticulitis. Vascular/Lymphatic: No significant vascular findings are present. No enlarged abdominal or pelvic lymph nodes. Reproductive: Status post hysterectomy. No adnexal masses. There is trace fluid in the pelvis. Other: No abdominal wall hernia or abnormality. No abdominopelvic ascites. Musculoskeletal: No acute or significant osseous findings. IMPRESSION: Findings are worrisome for acute cholecystitis. There is also intra  and extrahepatic biliary ductal dilatation, which raises the possibility for choledocholithiasis. Electronically Signed   By: HMarin RobertsM.D.   On: 06/13/2022 14:11    Cardiac Studies     Patient Profile     GAYANA IMHOFis a 81y.o. female with a hx of coronary artery disease who is being seen 06/13/2022 for the evaluation of elevated troponin flat 100, planned for ERCP 2/2 choledocholithiasis/multiple gallstones/acute cholecystitis at the request of Dr. ZRogene Houston  Assessment & Plan    Ischemic Heart Disease:  Hx of STEMI in 2011 with apical LAD as the culprit. Case c/b LM dissection requiring emergent CABGx2. 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 - echo today shows normal LV fxn in the base with known akinetic apex/aneursym, no signs of thrombus, no significant valve dx. -she denies CP/SOB; she's euvolemic -BP well controlled -normal rhythm  Pre-Op -she can proceed with ERCP/ lap chole from a cardiac standpoint -Jtc < 500 ms, can give occasional zofran if needed -can restart home meds on discharge  For questions or updates, please contact CBeckettPlease consult [email or web address] for contact info under        Signed, BJanina Mayo MD  06/14/2022, 1:05 PM

## 2022-06-14 NOTE — TOC Initial Note (Signed)
Transition of Care Amy Finley) - Initial/Assessment Note    Patient Details  Name: Amy Finley MRN: [id number] Date of Birth: May 29, 1941  Transition of Care [facility]) CM/SW Contact:    Leeroy Cha, RN Phone Number: 06/14/2022, 7:23 AM  Clinical Narrative:                  Transition of Care [facility]) Screening Note   Patient Details  Name: Amy Finley Date of Birth: Dec 28, 1940   Transition of Care [facility]) CM/SW Contact:    Leeroy Cha, RN Phone Number: 06/14/2022, 7:24 AM    Transition of Care Department (TOC) has reviewed patient and no TOC needs have been identified at this time. We will continue to monitor patient advancement through interdisciplinary progression rounds. If new patient transition needs arise, please place a TOC consult.    Expected Discharge Plan: Home/Self Care Barriers to Discharge: Continued Medical Work up   Patient Goals and CMS Choice Patient states their goals for this hospitalization and ongoing recovery are:: to go home and not hurt CMS Medicare.gov Compare Post Acute Care list provided to:: Patient    Expected Discharge Plan and Services Expected Discharge Plan: Home/Self Care   Discharge Planning Services: CM Consult   Living arrangements for the past 2 months: Single Family Home                                      Prior Living Arrangements/Services Living arrangements for the past 2 months: Single Family Home Lives with:: Self Patient language and need for interpreter reviewed:: Yes              Criminal Activity/Legal Involvement Pertinent to Current Situation/Hospitalization: No - Comment as needed  Activities of Daily Living Home Assistive Devices/Equipment: None ADL Screening (condition at time of admission) Patient's cognitive ability adequate to safely complete daily activities?: Yes Is the patient deaf or have difficulty hearing?: No Does the patient have difficulty seeing, even when wearing  glasses/contacts?: No Does the patient have difficulty concentrating, remembering, or making decisions?: No Patient able to express need for assistance with ADLs?: Yes Does the patient have difficulty dressing or bathing?: No Independently performs ADLs?: Yes (appropriate for developmental age) Does the patient have difficulty walking or climbing stairs?: No Weakness of Legs: None Weakness of Arms/Hands: None  Permission Sought/Granted                  Emotional Assessment Appearance:: Appears stated age Attitude/Demeanor/Rapport: Engaged Affect (typically observed): Calm Orientation: : Oriented to Self, Oriented to Place, Oriented to  Time, Oriented to Situation Alcohol / Substance Use: Tobacco Use (quit smoking 12 years ago) Psych Involvement: No (comment)  Admission diagnosis:  Choledocholithiasis with acute cholecystitis [K80.42] Patient Active Problem List   Diagnosis Date Noted   Choledocholithiasis with acute cholecystitis 06/13/2022   Abdominal pain 06/13/2022   Nausea & vomiting 06/13/2022   Hypokalemia 06/13/2022   Elevated troponin 06/13/2022   Prolonged QT interval 06/13/2022   Positive FIT (fecal immunochemical test) 02/10/2018   Chest pain 05/31/2017   Hyperlipidemia 06/22/2014   CAD (coronary artery disease) 02/13/2013   CLOSED FRACTURE OF OTHER BONE OF WRIST 03/08/2010   PCP:  [name], MD Pharmacy:   [facility] [place] [address] [place] [address] Phone: [phone] Fax: [phone]     Social Determinants of Health (SDOH)  Interventions    Readmission Risk Interventions   No data to display

## 2022-06-14 NOTE — Progress Notes (Signed)
Mobility Specialist - Progress Note   06/14/22 1200  Mobility  HOB Elevated/Bed Position Self regulated  Activity Ambulated with assistance in hallway  Range of Motion/Exercises Active  Level of Assistance Modified independent, requires aide device or extra time  Assistive Device  (IV Pole)  Distance Ambulated (ft) 400 ft  Activity Response Tolerated well  Transport method Ambulatory  $Mobility charge 1 Mobility   Pt received in bed and agreeable to mobility. C/o abdomen painduring mobility. Pt to bed after session with all needs met & Echo in room.   [facility]

## 2022-06-14 NOTE — Progress Notes (Signed)
Mobility Specialist - Progress Note   06/14/22 1507  Mobility  HOB Elevated/Bed Position Self regulated  Activity Ambulated with assistance in hallway  Range of Motion/Exercises Active  Level of Assistance Modified independent, requires aide device or extra time  Assistive Device  (IV Pole)  Distance Ambulated (ft) 500 ft  Activity Response Tolerated well  Transport method Ambulatory  $Mobility charge 1 Mobility   Pt received in bed and agreeable to mobility. C/o abdomen pain during mobility. Pt to bed after session with all needs met.    [facility]

## 2022-06-15 ENCOUNTER — Encounter (HOSPITAL_COMMUNITY): Payer: Self-pay | Admitting: Internal Medicine

## 2022-06-15 ENCOUNTER — Other Ambulatory Visit: Payer: Self-pay

## 2022-06-15 ENCOUNTER — Inpatient Hospital Stay (HOSPITAL_COMMUNITY): Payer: Medicare Other | Admitting: Anesthesiology

## 2022-06-15 ENCOUNTER — Encounter (HOSPITAL_COMMUNITY)
Admission: EM | Disposition: A | Discharge status: Home / Self Care | Payer: Self-pay | Source: Home / Self Care | Attending: Internal Medicine

## 2022-06-15 DIAGNOSIS — K8050 Calculus of bile duct without cholangitis or cholecystitis without obstruction: Secondary | ICD-10-CM

## 2022-06-15 DIAGNOSIS — Z87891 Personal history of nicotine dependence: Secondary | ICD-10-CM

## 2022-06-15 DIAGNOSIS — I252 Old myocardial infarction: Secondary | ICD-10-CM

## 2022-06-15 DIAGNOSIS — K8042 Calculus of bile duct with acute cholecystitis without obstruction: Secondary | ICD-10-CM | POA: Diagnosis not present

## 2022-06-15 DIAGNOSIS — I2510 Atherosclerotic heart disease of native coronary artery without angina pectoris: Secondary | ICD-10-CM

## 2022-06-15 HISTORY — PX: CHOLECYSTECTOMY: SHX55

## 2022-06-15 LAB — CBC
HCT: 40.9 % (ref 36.0–46.0)
Hemoglobin: 13.2 g/dL (ref 12.0–15.0)
MCH: 29.4 pg (ref 26.0–34.0)
MCHC: 32.3 g/dL (ref 30.0–36.0)
MCV: 91.1 fL (ref 80.0–100.0)
Platelets: 214 10*3/uL (ref 150–400)
RBC: 4.49 MIL/uL (ref 3.87–5.11)
RDW: 12.6 % (ref 11.5–15.5)
WBC: 11.9 10*3/uL — ABNORMAL HIGH (ref 4.0–10.5)
nRBC: 0 % (ref 0.0–0.2)

## 2022-06-15 LAB — COMPREHENSIVE METABOLIC PANEL
ALT: 16 U/L (ref 0–44)
AST: 18 U/L (ref 15–41)
Albumin: 3.1 g/dL — ABNORMAL LOW (ref 3.5–5.0)
Alkaline Phosphatase: 56 U/L (ref 38–126)
Anion gap: 7 (ref 5–15)
BUN: 10 mg/dL (ref 8–23)
CO2: 27 mmol/L (ref 22–32)
Calcium: 7.8 mg/dL — ABNORMAL LOW (ref 8.9–10.3)
Chloride: 106 mmol/L (ref 98–111)
Creatinine, Ser: 0.49 mg/dL (ref 0.44–1.00)
GFR, Estimated: >60 mL/min (ref >60)
Glucose, Bld: 101 mg/dL — ABNORMAL HIGH (ref 70–99)
Potassium: 3.1 mmol/L — ABNORMAL LOW (ref 3.5–5.1)
Sodium: 140 mmol/L (ref 135–145)
Total Bilirubin: 1.1 mg/dL (ref 0.3–1.2)
Total Protein: 5.7 g/dL — ABNORMAL LOW (ref 6.5–8.1)

## 2022-06-15 LAB — SURGICAL PCR SCREEN
MRSA, PCR: NEGATIVE
Staphylococcus aureus: NEGATIVE

## 2022-06-15 SURGERY — LAPAROSCOPIC CHOLECYSTECTOMY WITH INTRAOPERATIVE CHOLANGIOGRAM
Anesthesia: General | Site: Abdomen

## 2022-06-15 MED ORDER — ROCURONIUM BROMIDE 10 MG/ML (PF) SYRINGE
PREFILLED_SYRINGE | INTRAVENOUS | Status: AC
  Filled 2022-06-15: qty 10

## 2022-06-15 MED ORDER — FENTANYL CITRATE (PF) 100 MCG/2ML IJ SOLN
INTRAMUSCULAR | Status: DC | PRN
  Administered 2022-06-15 (×4): 50 ug via INTRAVENOUS

## 2022-06-15 MED ORDER — POTASSIUM CHLORIDE 10 MEQ/100ML IV SOLN
10.0000 meq | INTRAVENOUS | Status: AC
  Administered 2022-06-15 (×2): 10 meq via INTRAVENOUS
  Filled 2022-06-15: qty 100

## 2022-06-15 MED ORDER — LACTATED RINGERS IV SOLN: INTRAVENOUS | Status: DC

## 2022-06-15 MED ORDER — CHLORHEXIDINE GLUCONATE 0.12 % MT SOLN
15.0000 mL | Freq: Once | OROMUCOSAL | Status: AC
  Administered 2022-06-15: 15 mL via OROMUCOSAL

## 2022-06-15 MED ORDER — DEXAMETHASONE SODIUM PHOSPHATE 10 MG/ML IJ SOLN
INTRAMUSCULAR | Status: AC
  Filled 2022-06-15: qty 1

## 2022-06-15 MED ORDER — AMISULPRIDE (ANTIEMETIC) 5 MG/2ML IV SOLN: 10.0000 mg | Freq: Once | INTRAVENOUS | Status: DC | PRN

## 2022-06-15 MED ORDER — LACTATED RINGERS IR SOLN
Status: DC | PRN
  Administered 2022-06-15: 1000 mL

## 2022-06-15 MED ORDER — ONDANSETRON HCL 4 MG/2ML IJ SOLN
INTRAMUSCULAR | Status: DC | PRN
  Administered 2022-06-15: 4 mg via INTRAVENOUS

## 2022-06-15 MED ORDER — PROPOFOL 10 MG/ML IV BOLUS
INTRAVENOUS | Status: AC
  Filled 2022-06-15: qty 20

## 2022-06-15 MED ORDER — MORPHINE SULFATE (PF) 2 MG/ML IV SOLN: 2.0000 mg | INTRAVENOUS | Status: DC | PRN

## 2022-06-15 MED ORDER — PHENYLEPHRINE 80 MCG/ML (10ML) SYRINGE FOR IV PUSH (FOR BLOOD PRESSURE SUPPORT)
PREFILLED_SYRINGE | INTRAVENOUS | Status: DC | PRN
  Administered 2022-06-15: 120 ug via INTRAVENOUS

## 2022-06-15 MED ORDER — LIDOCAINE 2% (20 MG/ML) 5 ML SYRINGE
INTRAMUSCULAR | Status: DC | PRN
  Administered 2022-06-15: 100 mg via INTRAVENOUS

## 2022-06-15 MED ORDER — INDOMETHACIN 50 MG RE SUPP
100.0000 mg | Freq: Once | RECTAL | Status: DC
  Filled 2022-06-15: qty 2

## 2022-06-15 MED ORDER — ROCURONIUM BROMIDE 10 MG/ML (PF) SYRINGE
PREFILLED_SYRINGE | INTRAVENOUS | Status: DC | PRN
  Administered 2022-06-15: 60 mg via INTRAVENOUS

## 2022-06-15 MED ORDER — LIDOCAINE HCL (PF) 2 % IJ SOLN
INTRAMUSCULAR | Status: AC
  Filled 2022-06-15: qty 5

## 2022-06-15 MED ORDER — ONDANSETRON HCL 4 MG/2ML IJ SOLN
INTRAMUSCULAR | Status: AC
  Filled 2022-06-15: qty 2

## 2022-06-15 MED ORDER — HYDRALAZINE HCL 20 MG/ML IJ SOLN
5.0000 mg | INTRAMUSCULAR | Status: DC | PRN
  Administered 2022-06-15: 5 mg via INTRAVENOUS

## 2022-06-15 MED ORDER — PROPOFOL 10 MG/ML IV BOLUS
INTRAVENOUS | Status: DC | PRN
  Administered 2022-06-15: 120 mg via INTRAVENOUS
  Administered 2022-06-15: 40 mg via INTRAVENOUS

## 2022-06-15 MED ORDER — PHENYLEPHRINE 80 MCG/ML (10ML) SYRINGE FOR IV PUSH (FOR BLOOD PRESSURE SUPPORT)
PREFILLED_SYRINGE | INTRAVENOUS | Status: AC
  Filled 2022-06-15: qty 10

## 2022-06-15 MED ORDER — SODIUM CHLORIDE 0.9 % IV SOLN: INTRAVENOUS | Status: DC

## 2022-06-15 MED ORDER — ACETAMINOPHEN 500 MG PO TABS
1000.0000 mg | ORAL_TABLET | Freq: Once | ORAL | Status: AC
  Administered 2022-06-15: 1000 mg via ORAL
  Filled 2022-06-15: qty 2

## 2022-06-15 MED ORDER — FENTANYL CITRATE (PF) 100 MCG/2ML IJ SOLN
INTRAMUSCULAR | Status: AC
  Filled 2022-06-15: qty 2

## 2022-06-15 MED ORDER — 0.9 % SODIUM CHLORIDE (POUR BTL) OPTIME
TOPICAL | Status: DC | PRN
  Administered 2022-06-15: 1000 mL

## 2022-06-15 MED ORDER — BUPIVACAINE-EPINEPHRINE (PF) 0.25% -1:200000 IJ SOLN
INTRAMUSCULAR | Status: AC
  Filled 2022-06-15: qty 30

## 2022-06-15 MED ORDER — DEXAMETHASONE SODIUM PHOSPHATE 4 MG/ML IJ SOLN
INTRAMUSCULAR | Status: DC | PRN
  Administered 2022-06-15: 4 mg via INTRAVENOUS

## 2022-06-15 MED ORDER — HYDRALAZINE HCL 20 MG/ML IJ SOLN
INTRAMUSCULAR | Status: AC
  Filled 2022-06-15: qty 1

## 2022-06-15 MED ORDER — FENTANYL CITRATE PF 50 MCG/ML IJ SOSY: 25.0000 ug | PREFILLED_SYRINGE | INTRAMUSCULAR | Status: DC | PRN

## 2022-06-15 MED ORDER — ORAL CARE MOUTH RINSE: 15.0000 mL | Freq: Once | OROMUCOSAL | Status: AC

## 2022-06-15 MED ORDER — OXYCODONE HCL 5 MG PO TABS
5.0000 mg | ORAL_TABLET | ORAL | Status: DC | PRN
  Administered 2022-06-15 – 2022-06-16 (×3): 5 mg via ORAL
  Filled 2022-06-15 (×3): qty 1

## 2022-06-15 MED ORDER — SUGAMMADEX SODIUM 200 MG/2ML IV SOLN
INTRAVENOUS | Status: DC | PRN
  Administered 2022-06-15: 200 mg via INTRAVENOUS

## 2022-06-15 MED ORDER — BUPIVACAINE-EPINEPHRINE 0.25% -1:200000 IJ SOLN
INTRAMUSCULAR | Status: DC | PRN
  Administered 2022-06-15: 30 mL

## 2022-06-15 MED ORDER — OXYCODONE HCL 5 MG PO TABS
ORAL_TABLET | ORAL | Status: AC
  Filled 2022-06-15: qty 1

## 2022-06-15 SURGICAL SUPPLY — 47 items
APPLIER CLIP 5 13 M/L LIGAMAX5 (MISCELLANEOUS)
APPLIER CLIP ROT 10 11.4 M/L (STAPLE)
BAG COUNTER SPONGE SURGICOUNT (BAG) IMPLANT
BENZOIN TINCTURE PRP APPL 2/3 (GAUZE/BANDAGES/DRESSINGS) ×1 IMPLANT
BNDG ADH 1X3 SHEER STRL LF (GAUZE/BANDAGES/DRESSINGS) ×4 IMPLANT
CABLE HIGH FREQUENCY MONO STRZ (ELECTRODE) ×1 IMPLANT
CHLORAPREP W/TINT 26 (MISCELLANEOUS) ×1 IMPLANT
CLIP APPLIE 5 13 M/L LIGAMAX5 (MISCELLANEOUS) IMPLANT
CLIP APPLIE ROT 10 11.4 M/L (STAPLE) IMPLANT
CLIP LIGATING HEM O LOK PURPLE (MISCELLANEOUS) IMPLANT
CLIP LIGATING HEMO O LOK GREEN (MISCELLANEOUS) IMPLANT
COVER MAYO STAND XLG (MISCELLANEOUS) ×1 IMPLANT
COVER SURGICAL LIGHT HANDLE (MISCELLANEOUS) ×1 IMPLANT
DERMABOND ADVANCED .7 DNX12 (GAUZE/BANDAGES/DRESSINGS) IMPLANT
DRAIN CHANNEL 19F RND (DRAIN) IMPLANT
DRAPE C-ARM 42X120 X-RAY (DRAPES) IMPLANT
EVACUATOR SILICONE 100CC (DRAIN) IMPLANT
GLOVE BIOGEL PI IND STRL 7.0 (GLOVE) ×1 IMPLANT
GLOVE SURG SS PI 7.0 STRL IVOR (GLOVE) ×1 IMPLANT
GOWN STRL REUS W/ TWL LRG LVL3 (GOWN DISPOSABLE) ×1 IMPLANT
GOWN STRL REUS W/ TWL XL LVL3 (GOWN DISPOSABLE) IMPLANT
GOWN STRL REUS W/TWL LRG LVL3 (GOWN DISPOSABLE) ×1
GOWN STRL REUS W/TWL XL LVL3 (GOWN DISPOSABLE)
GRASPER SUT TROCAR 14GX15 (MISCELLANEOUS) IMPLANT
IRRIG SUCT STRYKERFLOW 2 WTIP (MISCELLANEOUS) ×1
IRRIGATION SUCT STRKRFLW 2 WTP (MISCELLANEOUS) ×1 IMPLANT
KIT BASIN OR (CUSTOM PROCEDURE TRAY) ×1 IMPLANT
KIT TURNOVER KIT A (KITS) IMPLANT
L-HOOK LAP DISP 36CM (ELECTROSURGICAL) ×1
LHOOK LAP DISP 36CM (ELECTROSURGICAL) IMPLANT
POUCH RETRIEVAL ECOSAC 10 (ENDOMECHANICALS) ×1 IMPLANT
POUCH RETRIEVAL ECOSAC 10MM (ENDOMECHANICALS) ×1
SCISSORS LAP 5X35 DISP (ENDOMECHANICALS) ×1 IMPLANT
SET CHOLANGIOGRAPH MIX (MISCELLANEOUS) IMPLANT
SET TUBE SMOKE EVAC HIGH FLOW (TUBING) ×1 IMPLANT
SLEEVE Z-THREAD 5X100MM (TROCAR) ×2 IMPLANT
SPIKE FLUID TRANSFER (MISCELLANEOUS) ×1 IMPLANT
STOPCOCK 4 WAY LG BORE MALE ST (IV SETS) IMPLANT
STRIP CLOSURE SKIN 1/2X4 (GAUZE/BANDAGES/DRESSINGS) ×1 IMPLANT
SUT ETHILON 2 0 PS N (SUTURE) IMPLANT
SUT MNCRL AB 4-0 PS2 18 (SUTURE) ×1 IMPLANT
SUT VICRYL 0 ENDOLOOP (SUTURE) IMPLANT
TOWEL OR 17X26 10 PK STRL BLUE (TOWEL DISPOSABLE) ×1 IMPLANT
TOWEL OR NON WOVEN STRL DISP B (DISPOSABLE) IMPLANT
TRAY LAPAROSCOPIC (CUSTOM PROCEDURE TRAY) ×1 IMPLANT
TROCAR 11X100 Z THREAD (TROCAR) ×1 IMPLANT
TROCAR Z-THREAD OPTICAL 5X100M (TROCAR) ×1 IMPLANT

## 2022-06-15 NOTE — Anesthesia Preprocedure Evaluation (Addendum)
Anesthesia Evaluation  Patient identified by MRN, date of birth, ID band Patient awake    Reviewed: Allergy & Precautions, NPO status , Patient's Chart, lab work & pertinent test results  History of Anesthesia Complications Negative for: history of anesthetic complications  Airway Mallampati: II  TM Distance: >3 FB Neck ROM: Full    Dental no notable dental hx. (+) Dental Advisory Given   Pulmonary former smoker,    Pulmonary exam normal        Cardiovascular + CAD, + Past MI and + CABG  Normal cardiovascular exam  IMPRESSIONS    1. Left ventricular ejection fraction, by estimation, is 35 to 40%. The  left ventricle has moderately decreased function. The left ventricle  demonstrates regional wall motion abnormalities (see scoring  diagram/findings for description). Left ventricular  diastolic parameters are consistent with Grade I diastolic dysfunction  (impaired relaxation).  2. Right ventricular systolic function is normal. The right ventricular  size is normal. There is normal pulmonary artery systolic pressure. The  estimated right ventricular systolic pressure is 8.8 mmHg.  3. The mitral valve is grossly normal. No evidence of mitral valve  regurgitation. No evidence of mitral stenosis.  4. The aortic valve is tricuspid. Aortic valve regurgitation is not  visualized. No aortic stenosis is present.  5. The inferior vena cava is normal in size with greater than 50%  respiratory variability, suggesting right atrial pressure of 3 mmHg.   Conclusion(s)/Recommendation(s): Findings consistent with ischemic  cardiomyopathy.    Neuro/Psych negative neurological ROS     GI/Hepatic negative GI ROS, Neg liver ROS,   Endo/Other  negative endocrine ROS  Renal/GU negative Renal ROS     Musculoskeletal negative musculoskeletal ROS (+)   Abdominal   Peds  Hematology negative hematology ROS (+)   Anesthesia  Other Findings Cardiology:  Right upper quadrant pain with elevated white count.  She is going to have GI evaluation.  If endoscopy is necessary or if surgery is necessary, she may proceed from a cardiac perspective with moderate overall risk based upon her prior coronary artery disease history  Reproductive/Obstetrics                            Anesthesia Physical Anesthesia Plan  ASA: 3  Anesthesia Plan: General   Post-op Pain Management: Tylenol PO (pre-op)*   Induction: Intravenous  PONV Risk Score and Plan: 4 or greater and Ondansetron, Dexamethasone and Midazolam  Airway Management Planned: Oral ETT  Additional Equipment: None  Intra-op Plan:   Post-operative Plan: Extubation in OR  Informed Consent: I have reviewed the patients History and Physical, chart, labs and discussed the procedure including the risks, benefits and alternatives for the proposed anesthesia with the patient or authorized representative who has indicated his/her understanding and acceptance.     Dental advisory given  Plan Discussed with: Anesthesiologist and CRNA  Anesthesia Plan Comments:        Anesthesia Quick Evaluation

## 2022-06-15 NOTE — Discharge Instructions (Signed)
[facility]  Please arrive at least 30 min before your appointment to complete your check in paperwork.  If you are unable to arrive 30 min prior to your appointment time we may have to cancel or reschedule you. LAPAROSCOPIC SURGERY: POST OP INSTRUCTIONS Always review your discharge instruction sheet given to you by the facility where your surgery was performed. IF YOU HAVE DISABILITY OR FAMILY LEAVE FORMS, YOU MUST BRING THEM TO THE OFFICE FOR PROCESSING.   DO NOT GIVE THEM TO YOUR DOCTOR.  PAIN CONTROL  First take acetaminophen (Tylenol) AND/or ibuprofen (Advil) to control your pain after surgery.  Follow directions on package.  Taking acetaminophen (Tylenol) and/or ibuprofen (Advil) regularly after surgery will help to control your pain and lower the amount of prescription pain medication you may need.  You should not take more than 4,000 mg (4 grams) of acetaminophen (Tylenol) in 24 hours.  You should not take ibuprofen (Advil), aleve, motrin, naprosyn or other NSAIDS if you have a history of stomach ulcers or chronic kidney disease.  A prescription for pain medication may be given to you upon discharge.  Take your pain medication as prescribed, if you still have uncontrolled pain after taking acetaminophen (Tylenol) or ibuprofen (Advil). Use ice packs to help control pain. If you need a refill on your pain medication, please contact your pharmacy.  They will contact our office to request authorization. Prescriptions will not be filled after 5pm or on week-ends.  HOME MEDICATIONS Take your usually prescribed medications unless otherwise directed.  DIET You should follow a light diet the first few days after arrival home.  Be sure to include lots of fluids daily. Avoid fatty, fried foods.   CONSTIPATION It is common to experience some constipation after surgery and if you are taking pain medication.  Increasing fluid intake and taking a stool softener (such as Colace)  will usually help or prevent this problem from occurring.  A mild laxative (Milk of Magnesia or Miralax) should be taken according to package instructions if there are no bowel movements after 48 hours.  WOUND/INCISION CARE Most patients will experience some swelling and bruising in the area of the incisions.  Ice packs will help.  Swelling and bruising can take several days to resolve.  Unless discharge instructions indicate otherwise, follow guidelines below  STERI-STRIPS - you may remove your outer bandages 48 hours after surgery, and you may shower at that time.  You have steri-strips (small skin tapes) in place directly over the incision.  These strips should be left on the skin for 7-10 days.   DERMABOND/SKIN GLUE - you may shower in 24 hours.  The glue will flake off over the next 2-3 weeks. Any sutures or staples will be removed at the office during your follow-up visit.  ACTIVITIES You may resume regular (light) daily activities beginning the next day--such as daily self-care, walking, climbing stairs--gradually increasing activities as tolerated.  You may have sexual intercourse when it is comfortable.  Refrain from any heavy lifting or straining until approved by your doctor. You may drive when you are no longer taking prescription pain medication, you can comfortably wear a seatbelt, and you can safely maneuver your car and apply brakes.  FOLLOW-UP You should see your doctor in the office for a follow-up appointment approximately 2-3 weeks after your surgery.  You should have been given your post-op/follow-up appointment when your surgery was scheduled.  If you did not receive a post-op/follow-up appointment, make sure   that you call for this appointment within a day or two after you arrive home to insure a convenient appointment time.   WHEN TO CALL YOUR DOCTOR: Fever over 101.0 Inability to urinate Continued bleeding from incision. Increased pain, redness, or drainage from the  incision. Increasing abdominal pain  The clinic staff is available to answer your questions during regular business hours.  Please don't hesitate to call and ask to speak to one of the nurses for clinical concerns.  If you have a medical emergency, go to the nearest emergency room or call 911.  A surgeon from [facility] is always on call at the hospital. [address], [address], [place], [place]  [address] ? [address] [address], [place], [place]   [address] [phone] ? [phone] ? FAX [phone]  

## 2022-06-15 NOTE — Anesthesia Postprocedure Evaluation (Signed)
Anesthesia Post Note  Patient: Amy Finley  Procedure(s) Performed: LAPAROSCOPIC CHOLECYSTECTOMY WITH INTRAOPERATIVE CHOLANGIOGRAM (Abdomen)     Patient location during evaluation: PACU Anesthesia Type: General Level of consciousness: awake and alert, oriented and patient cooperative Pain management: pain level controlled Vital Signs Assessment: post-procedure vital signs reviewed and stable Respiratory status: spontaneous breathing, nonlabored ventilation and respiratory function stable Cardiovascular status: blood pressure returned to baseline and stable Postop Assessment: no apparent nausea or vomiting Anesthetic complications: no   No notable events documented.  Last Vitals:  Vitals:   06/15/22 1545 06/15/22 1606  BP: (!) 147/64 137/62  Pulse: 87 93  Resp: (!) 29 16  Temp:  (!) 36.3 C  SpO2: 98% 94%    Last Pain:  Vitals:   06/15/22 1606  TempSrc: Oral  PainSc: 0-No pain                 Pervis Hocking

## 2022-06-15 NOTE — Transfer of Care (Signed)
Immediate Anesthesia Transfer of Care Note  Patient: Amy Finley  Procedure(s) Performed: LAPAROSCOPIC CHOLECYSTECTOMY WITH INTRAOPERATIVE CHOLANGIOGRAM (Abdomen)  Patient Location: PACU  Anesthesia Type:General  Level of Consciousness: awake and patient cooperative  Airway & Oxygen Therapy: Patient Spontanous Breathing and Patient connected to face mask  Post-op Assessment: Report given to RN and Post -op Vital signs reviewed and stable  Post vital signs: Reviewed and stable  Last Vitals:  Vitals Value Taken Time  BP    Temp    Pulse    Resp    SpO2      Last Pain:  Vitals:   06/15/22 1111  TempSrc: Oral  PainSc: 0-No pain      Patients Stated Pain Goal: 3 (35/78/97 [id number])  Complications: No notable events documented.

## 2022-06-15 NOTE — Progress Notes (Signed)
Pre Procedure note for inpatients:   Amy Finley has been scheduled for Procedure(s): LAPAROSCOPIC CHOLECYSTECTOMY WITH INTRAOPERATIVE CHOLANGIOGRAM (N/A) today. The various methods of treatment have been discussed with the patient. After consideration of the risks, benefits and treatment options the patient has consented to the planned procedure.   The patient has been seen and labs reviewed. There are no changes in the patient's condition to prevent proceeding with the planned procedure today.  Recent labs:  Lab Results  Component Value Date   WBC 11.9 (H) 06/15/2022   HGB 13.2 06/15/2022   HCT 40.9 06/15/2022   PLT 214 06/15/2022   GLUCOSE 101 (H) 06/15/2022   ALT 16 06/15/2022   AST 18 06/15/2022   NA 140 06/15/2022   K 3.1 (L) 06/15/2022   CL 106 06/15/2022   CREATININE 0.49 06/15/2022   BUN 10 06/15/2022   CO2 27 06/15/2022   INR 0.74 04/08/2010   HGBA1C  04/07/2010    5.5 (NOTE)                                                                       According to the ADA Clinical Practice Recommendations for 2011, when HbA1c is used as a screening test:   >=6.5%   Diagnostic of Diabetes Mellitus           (if abnormal result  is confirmed)  5.7-6.4%   Increased risk of developing Diabetes Mellitus  References:Diagnosis and Classification of Diabetes Mellitus,Diabetes VXBL,3903,00(PQZRA 1):S62-S69 and Standards of Medical Care in         Diabetes - 2011,Diabetes Care,2011,34  (Suppl 1):S11-S61.    Mickeal Skinner, MD 06/15/2022 12:20 PM

## 2022-06-15 NOTE — Progress Notes (Signed)
PROGRESS NOTE    Amy Finley  HWE:[id number] DOB: 13-Feb-1941 DOA: 06/13/2022 PCP: [name], MD    Brief Narrative:  Amy Finley is a 81 y.o. female with medical history significant of coronary artery disease status post stenting and subsequent emergent CABG for left main dissection in 2011 who presents to the emergency department due to abdominal pain which started this morning.  She thought it was due to gallstones since she had similar symptoms last year that subsequently resolved and did not do any surgery at that time. Going for cholecystectomy on 9/22.   Assessment and Plan: Abdominal pain, nausea and vomiting due to choledocholithiasis with acute cholecystitis -IV abx -IVF -Continue IV Zofran p.r.n. -cholecystectomy 9/22   Hypokalemia possibly due to vomiting -repleted   Elevated troponin possibly due to type II demand ischemia Cardiology was consulted and felt that this was due to demand ischemia -echo done   Prolonged QT interval QTc 510m -repeat EKG   CAD s/p STEMI with prior bypass Stable, patient was on aspirin No antihyperlipidemic drug noted in med rec     DVT prophylaxis: SCDs Start: 06/13/22 2347    Code Status: Full Code   Disposition Plan:  Level of care: Med-Surg Status is: Inpatient Remains inpatient appropriate because: GB out    Consultants:  GI GS   Subjective: No SOB, no CP, excited to have surgery today  Objective: Vitals:   06/14/22 1419 06/14/22 2130 06/15/22 0554 06/15/22 1111  BP: (!) 142/56 (!) 143/60 (!) 150/63 (!) 143/69  Pulse: 80 79 78 75  Resp: '18 18 18 18  '$ Temp: 98.6 F (37 C) 99.5 F (37.5 C) 99.1 F (37.3 C) 98.9 F (37.2 C)  TempSrc: Oral   Oral  SpO2: 98% 97% 96% 98%  Weight:    67.1 kg  Height:    '5\' 2"'$  (1.575 m)    Intake/Output Summary (Last 24 hours) at 06/15/2022 1141 Last data filed at 06/15/2022 1000 Gross per 24 hour  Intake 2638.12 ml  Output 1701 ml  Net 937.12 ml   Filed Weights    06/15/22 1111  Weight: 67.1 kg    Examination:   General: Appearance:     Overweight female in no acute distress     Lungs:      respirations unlabored  Heart:    Normal heart rate.    MS:   All extremities are intact.    Neurologic:   Awake, alert       Data Reviewed: I have personally reviewed following labs and imaging studies  CBC: Recent Labs  Lab 06/13/22 1208 06/14/22 0501 06/15/22 0439  WBC 14.7* 14.7* 11.9*  NEUTROABS 12.6*  --   --   HGB 14.9 14.5 13.2  HCT 43.9 44.5 40.9  MCV 87.5 91.4 91.1  PLT 250 247 2893  Basic Metabolic Panel: Recent Labs  Lab 06/13/22 1208 06/14/22 0501 06/15/22 0439  NA 137 139 140  K 2.9* 3.9 3.1*  CL 99 105 106  CO2 '29 30 27  '$ GLUCOSE 152* 113* 101*  BUN '12 11 10  '$ CREATININE 0.63 0.63 0.49  CALCIUM 8.7* 8.2* 7.8*  MG  --  2.0  --   PHOS  --  2.6  --    GFR: Estimated Creatinine Clearance: 51.2 mL/min (by C-G formula based on SCr of 0.49 mg/dL). Liver Function Tests: Recent Labs  Lab 06/13/22 1208 06/14/22 0501 06/15/22 0439  AST '29 25 18  '$ ALT 23 19 16  ALKPHOS 68 60 56  BILITOT 0.9 1.1 1.1  PROT 7.1 6.1* 5.7*  ALBUMIN 4.0 3.4* 3.1*   Recent Labs  Lab 06/13/22 1208  LIPASE 26   No results for input(s): "AMMONIA" in the last 168 hours. Coagulation Profile: No results for input(s): "INR", "PROTIME" in the last 168 hours. Cardiac Enzymes: No results for input(s): "CKTOTAL", "CKMB", "CKMBINDEX", "TROPONINI" in the last 168 hours. BNP (last 3 results) No results for input(s): "PROBNP" in the last 8760 hours. HbA1C: No results for input(s): "HGBA1C" in the last 72 hours. CBG: No results for input(s): "GLUCAP" in the last 168 hours. Lipid Profile: No results for input(s): "CHOL", "HDL", "LDLCALC", "TRIG", "CHOLHDL", "LDLDIRECT" in the last 72 hours. Thyroid Function Tests: No results for input(s): "TSH", "T4TOTAL", "FREET4", "T3FREE", "THYROIDAB" in the last 72 hours. Anemia Panel: No results for  input(s): "VITAMINB12", "FOLATE", "FERRITIN", "TIBC", "IRON", "RETICCTPCT" in the last 72 hours. Sepsis Labs: No results for input(s): "PROCALCITON", "LATICACIDVEN" in the last 168 hours.  Recent Results (from the past 240 hour(s))  Surgical pcr screen     Status: None   Collection Time: 06/15/22  5:40 AM   Specimen: Nasal Mucosa; Nasal Swab  Result Value Ref Range Status   MRSA, PCR NEGATIVE NEGATIVE Final   Staphylococcus aureus NEGATIVE NEGATIVE Final    Comment: (NOTE) The Xpert SA Assay (FDA approved for NASAL specimens in patients 49 years of age and older), is one component of a comprehensive surveillance program. It is not intended to diagnose infection nor to guide or monitor treatment. Performed at [facility], [place] [address] [place], [place] [address]          Radiology Studies: ECHOCARDIOGRAM COMPLETE  Result Date: 06/14/2022    ECHOCARDIOGRAM REPORT   Patient Name:   Amy Finley Date of Exam: 06/14/2022 Medical Rec #:  [id number]      Height:       63.0 in Accession #:    [id number]     Weight:       150.0 lb Date of Birth:  05/01/1941      BSA:          35.711 m Patient Age:    51 years       BP:           133/56 mmHg Patient Gender: F              HR:           80 bpm. Exam Location:  Inpatient Procedure: 2D Echo and Intracardiac Opacification Agent Indications:    Abnormal ECG  History:        Patient has prior history of Echocardiogram examinations, most                 recent 06/30/2010. CAD and Previous Myocardial Infarction,                 Signs/Symptoms:Chest Pain; Risk Factors:Dyslipidemia.  Sonographer:    Harvie Junior Referring Phys: [id number] OLADAPO ADEFESO  Sonographer Comments: Technically difficult study due to poor echo windows. Image acquisition challenging due to respiratory motion. IMPRESSIONS  1. Left ventricular ejection fraction, by estimation, is 35 to 40%. The left ventricle has moderately decreased function. The left ventricle  demonstrates regional wall motion abnormalities (see scoring diagram/findings for description). Left ventricular  diastolic parameters are consistent with Grade I diastolic dysfunction (impaired relaxation).  2. Right ventricular systolic function is normal. The right ventricular size is normal. There  is normal pulmonary artery systolic pressure. The estimated right ventricular systolic pressure is 8.8 mmHg.  3. The mitral valve is grossly normal. No evidence of mitral valve regurgitation. No evidence of mitral stenosis.  4. The aortic valve is tricuspid. Aortic valve regurgitation is not visualized. No aortic stenosis is present.  5. The inferior vena cava is normal in size with greater than 50% respiratory variability, suggesting right atrial pressure of 3 mmHg. Conclusion(s)/Recommendation(s): Findings consistent with ischemic cardiomyopathy. FINDINGS  Left Ventricle: Left ventricular ejection fraction, by estimation, is 35 to 40%. The left ventricle has moderately decreased function. The left ventricle demonstrates regional wall motion abnormalities. Definity contrast agent was given IV to delineate the left ventricular endocardial borders. The left ventricular internal cavity size was normal in size. There is no left ventricular hypertrophy. Left ventricular diastolic parameters are consistent with Grade I diastolic dysfunction (impaired relaxation).  LV Wall Scoring: The mid and distal anterior septum, apical anterior segment, and apex are akinetic. Right Ventricle: The right ventricular size is normal. No increase in right ventricular wall thickness. Right ventricular systolic function is normal. There is normal pulmonary artery systolic pressure. The tricuspid regurgitant velocity is 1.20 m/s, and  with an assumed right atrial pressure of 3 mmHg, the estimated right ventricular systolic pressure is 8.8 mmHg. Left Atrium: Left atrial size was normal in size. Right Atrium: Right atrial size was normal in size.  Pericardium: There is no evidence of pericardial effusion. Mitral Valve: The mitral valve is grossly normal. No evidence of mitral valve regurgitation. No evidence of mitral valve stenosis. Tricuspid Valve: The tricuspid valve is grossly normal. Tricuspid valve regurgitation is not demonstrated. No evidence of tricuspid stenosis. Aortic Valve: The aortic valve is tricuspid. Aortic valve regurgitation is not visualized. No aortic stenosis is present. Aortic valve mean gradient measures 4.5 mmHg. Aortic valve peak gradient measures 8.4 mmHg. Aortic valve area, by VTI measures 2.13 cm. Pulmonic Valve: The pulmonic valve was grossly normal. Pulmonic valve regurgitation is not visualized. No evidence of pulmonic stenosis. Aorta: The aortic root and ascending aorta are structurally normal, with no evidence of dilitation. Venous: The inferior vena cava is normal in size with greater than 50% respiratory variability, suggesting right atrial pressure of 3 mmHg. IAS/Shunts: The atrial septum is grossly normal.  LEFT VENTRICLE PLAX 2D LVIDd:         4.80 cm      Diastology LVIDs:         3.50 cm      LV e' medial:    5.98 cm/s LV PW:         1.00 cm      LV E/e' medial:  9.8 LV IVS:        1.00 cm      LV e' lateral:   4.68 cm/s LVOT diam:     2.00 cm      LV E/e' lateral: 12.5 LV SV:         49 LV SV Index:   29 LVOT Area:     3.14 cm  LV Volumes (MOD) LV vol d, MOD A2C: 95.5 ml LV vol d, MOD A4C: 110.0 ml LV vol s, MOD A2C: 57.0 ml LV vol s, MOD A4C: 69.1 ml LV SV MOD A2C:     38.5 ml LV SV MOD A4C:     110.0 ml LV SV MOD BP:      42.1 ml RIGHT VENTRICLE RV S prime:     10.50 cm/s  TAPSE (M-mode): 1.6 cm LEFT ATRIUM             Index        RIGHT ATRIUM           Index LA diam:        3.30 cm 1.93 cm/m   RA Area:     11.70 cm LA Vol (A2C):   34.8 ml 20.34 ml/m  RA Volume:   25.70 ml  15.02 ml/m LA Vol (A4C):   25.0 ml 14.61 ml/m LA Biplane Vol: 29.6 ml 17.30 ml/m  AORTIC VALVE                    PULMONIC VALVE AV Area  (Vmax):    2.13 cm     PV Vmax:       1.05 m/s AV Area (Vmean):   2.06 cm     PV Peak grad:  4.4 mmHg AV Area (VTI):     2.13 cm AV Vmax:           145.00 cm/s AV Vmean:          96.000 cm/s AV VTI:            0.232 m AV Peak Grad:      8.4 mmHg AV Mean Grad:      4.5 mmHg LVOT Vmax:         98.50 cm/s LVOT Vmean:        63.000 cm/s LVOT VTI:          0.157 m LVOT/AV VTI ratio: 0.68  AORTA Ao Root diam: 3.10 cm Ao Asc diam:  3.10 cm MITRAL VALVE               TRICUSPID VALVE MV Area (PHT): 3.48 cm    TR Peak grad:   5.8 mmHg MV Decel Time: 218 msec    TR Vmax:        120.00 cm/s MR Peak grad: 9.5 mmHg MR Vmax:      154.00 cm/s  SHUNTS MV E velocity: 58.70 cm/s  Systemic VTI:  0.16 m MV A velocity: 89.50 cm/s  Systemic Diam: 2.00 cm MV E/A ratio:  0.66 Amy Chiquito MD Electronically signed by Amy Chiquito MD Signature Date/Time: 06/14/2022/2:08:25 PM    Final    MR ABDOMEN MRCP W WO CONTAST  Result Date: 06/13/2022 CLINICAL DATA:  Cholelithiasis RIGHT upper quadrant pain. EXAM: MRI ABDOMEN WITHOUT AND WITH CONTRAST (INCLUDING MRCP) TECHNIQUE: Multiplanar multisequence MR imaging of the abdomen was performed both before and after the administration of intravenous contrast. Heavily T2-weighted images of the biliary and pancreatic ducts were obtained, and three-dimensional MRCP images were rendered by post processing. CONTRAST:  29m GADAVIST GADOBUTROL 1 MMOL/ML IV SOLN COMPARISON:  CT 06/13/2022 FINDINGS: Lower chest:  Lung bases are clear. Hepatobiliary: Multiple gallstones layer within the lumen the gallbladder. There is moderate volume of Peri cholecystic fluid and pericholecystic inflammation (image 21/series 5). There are numerous stones which measure proximally 3 mm each. There is mild intrahepatic biliary duct dilatation. The common hepatic duct measures 7 mm. The common bile duct measures 7 mm. There is a subtle signal void in the distal common bile duct measuring 6 mm on image 12/series 4. No discrete  hepatic lesion Pancreas: Normal pancreatic parenchymal intensity. No ductal dilatation or inflammation. Spleen: Normal spleen. Adrenals/urinary tract: Adrenal glands and kidneys are normal. Stomach/Bowel: Stomach and limited of the small bowel is unremarkable Vascular/Lymphatic: Abdominal aortic normal caliber.  No retroperitoneal periportal lymphadenopathy. Musculoskeletal: No aggressive osseous lesion IMPRESSION: 1. Signal void in the distal common bile duct just above the ampulla is most consistent with choledocholithiasis. 2. Mild intrahepatic and extrahepatic biliary duct dilatation. 3. Multiple gallstones and pericholecystic fluid consistent with acute cholecystitis. Electronically Signed   By: Suzy Bouchard M.D.   On: 06/13/2022 17:10   MR 3D Recon At Scanner  Result Date: 06/13/2022 CLINICAL DATA:  Cholelithiasis RIGHT upper quadrant pain. EXAM: MRI ABDOMEN WITHOUT AND WITH CONTRAST (INCLUDING MRCP) TECHNIQUE: Multiplanar multisequence MR imaging of the abdomen was performed both before and after the administration of intravenous contrast. Heavily T2-weighted images of the biliary and pancreatic ducts were obtained, and three-dimensional MRCP images were rendered by post processing. CONTRAST:  104m GADAVIST GADOBUTROL 1 MMOL/ML IV SOLN COMPARISON:  CT 06/13/2022 FINDINGS: Lower chest:  Lung bases are clear. Hepatobiliary: Multiple gallstones layer within the lumen the gallbladder. There is moderate volume of Peri cholecystic fluid and pericholecystic inflammation (image 21/series 5). There are numerous stones which measure proximally 3 mm each. There is mild intrahepatic biliary duct dilatation. The common hepatic duct measures 7 mm. The common bile duct measures 7 mm. There is a subtle signal void in the distal common bile duct measuring 6 mm on image 12/series 4. No discrete hepatic lesion Pancreas: Normal pancreatic parenchymal intensity. No ductal dilatation or inflammation. Spleen: Normal spleen.  Adrenals/urinary tract: Adrenal glands and kidneys are normal. Stomach/Bowel: Stomach and limited of the small bowel is unremarkable Vascular/Lymphatic: Abdominal aortic normal caliber. No retroperitoneal periportal lymphadenopathy. Musculoskeletal: No aggressive osseous lesion IMPRESSION: 1. Signal void in the distal common bile duct just above the ampulla is most consistent with choledocholithiasis. 2. Mild intrahepatic and extrahepatic biliary duct dilatation. 3. Multiple gallstones and pericholecystic fluid consistent with acute cholecystitis. Electronically Signed   By: SSuzy BouchardM.D.   On: 06/13/2022 17:10   UKoreaAbdomen Limited  Result Date: 06/13/2022 CLINICAL DATA:  RUQ pain EXAM: ULTRASOUND ABDOMEN LIMITED RIGHT UPPER QUADRANT COMPARISON:  None Available. FINDINGS: Gallbladder: Multiple small layering gallstones with marked thickening of the gallbladder wall. Pericholecystic fluid. Also felt positive sonographic Murphy sign per the technologist. Common bile duct: Diameter: Dilated, measuring 9 mm Liver: No focal lesion identified. Within normal limits in parenchymal echogenicity. Portal vein is patent on color Doppler imaging with normal direction of blood flow towards the liver. Trace perihepatic fluid. IMPRESSION: 1. Findings concerning for acute cholecystitis, detailed above. 2. Dilated common bile duct, raising concern for possible choledocholithiasis. MRCP could further evaluate. Electronically Signed   By: FMargaretha SheffieldM.D.   On: 06/13/2022 15:36   DG Chest Portable 1 View  Result Date: 06/13/2022 CLINICAL DATA:  RUQ pain, elevated trop EXAM: PORTABLE CHEST 1 VIEW COMPARISON:  Radiograph 05/31/2017 FINDINGS: Unchanged cardiomediastinal silhouette with prior median sternotomy. No focal airspace consolidation. There is no large pleural effusion. No pneumothorax. IMPRESSION: No evidence of acute cardiopulmonary disease. Electronically Signed   By: JMaurine SimmeringM.D.   On: 06/13/2022  14:24   CT ABDOMEN PELVIS W CONTRAST  Result Date: 06/13/2022 CLINICAL DATA:  Right upper quadrant pain EXAM: CT ABDOMEN AND PELVIS WITH CONTRAST TECHNIQUE: Multidetector CT imaging of the abdomen and pelvis was performed using the standard protocol following bolus administration of intravenous contrast. RADIATION DOSE REDUCTION: This exam was performed according to the departmental dose-optimization program which includes automated exposure control, adjustment of the mA and/or kV according to patient size and/or use of iterative reconstruction technique. CONTRAST:  1056mOMNIPAQUE  IOHEXOL 300 MG/ML  SOLN COMPARISON:  None Available. FINDINGS: Lower chest: Bibasilar atelectasis.  Status post median sternotomy. Hepatobiliary: No focal liver lesions are visualized. There is mild intrahepatic biliary ductal dilatation. There is cholelithiasis and gallbladder sludge with marked gallbladder wall thickening measuring up to 7 mm. There is mucosal hyperenhancement. The common bile duct is also slightly enlarged for age measuring up to 9 mm. No definite radiopaque stone is visualized within the common bile duct. Pancreas: Unremarkable. No pancreatic ductal dilatation or surrounding inflammatory changes. Spleen: Normal in size without focal abnormality. Adrenals/Urinary Tract: Adrenal glands are unremarkable. Kidneys are normal, without renal calculi, focal lesion, or hydronephrosis. Urinary bladder is fluid-filled and slightly distended. Stomach/Bowel: Stomach is within normal limits. Appendix appears normal. No evidence of bowel wall thickening, distention, or inflammatory changes. There is diverticulosis without diverticulitis. Vascular/Lymphatic: No significant vascular findings are present. No enlarged abdominal or pelvic lymph nodes. Reproductive: Status post hysterectomy. No adnexal masses. There is trace fluid in the pelvis. Other: No abdominal wall hernia or abnormality. No abdominopelvic ascites.  Musculoskeletal: No acute or significant osseous findings. IMPRESSION: Findings are worrisome for acute cholecystitis. There is also intra and extrahepatic biliary ductal dilatation, which raises the possibility for choledocholithiasis. Electronically Signed   By: Marin Roberts M.D.   On: 06/13/2022 14:11        Scheduled Meds:  [MAR Hold] indomethacin  100 mg Rectal Once   Continuous Infusions:  sodium chloride     [MAR Hold] cefTRIAXone (ROCEPHIN)  IV 1 g (06/14/22 2029)   lactated ringers 10 mL/hr at 06/15/22 1104   [MAR Hold] metronidazole 500 mg (06/15/22 1107)     LOS: 2 days    Time spent: 45 minutes spent on chart review, discussion with nursing staff, consultants, updating family and interview/physical exam; more than 50% of that time was spent in counseling and/or coordination of care.    Geradine Girt, DO [facility] Available via Epic secure chat 7am-7pm After these hours, please refer to coverage provider listed on [email or web address] 06/15/2022, 11:41 AM

## 2022-06-15 NOTE — Anesthesia Procedure Notes (Signed)
Procedure Name: Intubation Date/Time: 06/15/2022 1:20 PM  Performed by: Claudia Desanctis, CRNAPre-anesthesia Checklist: Patient identified, Emergency Drugs available, Suction available and Patient being monitored Patient Re-evaluated:Patient Re-evaluated prior to induction Oxygen Delivery Method: Circle system utilized Preoxygenation: Pre-oxygenation with 100% oxygen Induction Type: IV induction Ventilation: Mask ventilation without difficulty Laryngoscope Size: 2 and Miller Grade View: Grade I Tube type: Oral Tube size: 7.0 mm Number of attempts: 1 Airway Equipment and Method: Stylet Placement Confirmation: ETT inserted through vocal cords under direct vision, positive ETCO2 and breath sounds checked- equal and bilateral Secured at: 21 cm Tube secured with: Tape Dental Injury: Teeth and Oropharynx as per pre-operative assessment

## 2022-06-15 NOTE — Op Note (Signed)
PATIENT:  Amy Finley  81 y.o. female  PRE-OPERATIVE DIAGNOSIS:  choledocholithiasis  POST-OPERATIVE DIAGNOSIS:  choledocholithiasis  PROCEDURE:  Procedure(s): LAPAROSCOPIC CHOLECYSTECTOMY  SURGEON:  Marrietta Thunder, Arta Bruce, MD   ASSISTANT: Toula Moos, M.D.  ANESTHESIA:   local and general  Indications for procedure: [name] is a 81 y.o. female with symptoms of Abdominal pain and Nausea and vomiting consistent with gallbladder disease, Confirmed by ultrasound and MRI.  Description of procedure: The patient was brought into the operative suite, placed supine. Anesthesia was administered with endotracheal tube. Patient was strapped in place and foot board was secured. All pressure points were offloaded by foam padding. The patient was prepped and draped in the usual sterile fashion.  A left subcostal incision was made and optical entry was used to enter the abdomen. 2 5 mm trocars were placed on in the right lateral space on in the right subcostal space. A 37m trocar was placed in the supraumbilical space. Marcaine was infused to the subxiphoid space and lateral upper right abdomen in the transversus abdominis plane. Next the patient was placed in reverse trendelenberg. The gallbladder appearedacutely inflamed. Omentum was adhered to the gallbladder and was taken down with cautery/blunt dissection and Due to the level of dilation, the gallbladder was evacuated with suction.  The gallbladder was retracted cephalad and lateral. The peritoneum was reflected off the infundibulum working lateral to medial. The cystic duct and cystic artery were identified and further dissection revealed a critical view. Due to level of inflammation and size of duct, I was unable to perform IOC. The cystic duct and cystic artery were doubly clipped and ligated. PDS endoloop was used to reinforce the cystic duct due to its size.  The gallbladder was removed off the liver bed with cautery. The Gallbladder was  placed in a specimen bag. The gallbladder fossa was irrigated and hemostasis was applied with cautery. The gallbladder was removed via the 139mtrocar. The fascial defect was closed with interrupted 0 vicryl suture via laparoscopic trans-fascial suture passer and loose stones were removed. Pneumoperitoneum was removed, all trocar were removed. All incisions were closed with 4-0 monocryl subcuticular stitch. The patient woke from anesthesia and was brought to PACU in stable condition. All counts were correct  Findings: acute cholecystitis  Specimen: gallbladder  Blood loss: 20 ml  Local anesthesia: 30 ml Marcaine  Complications: none  PLAN OF CARE: Admit to inpatient   PATIENT DISPOSITION:  PACU - hemodynamically stable.  LuDixonurgery, PAU[place]

## 2022-06-16 ENCOUNTER — Encounter (HOSPITAL_COMMUNITY): Payer: Self-pay | Admitting: General Surgery

## 2022-06-16 ENCOUNTER — Encounter (HOSPITAL_COMMUNITY)
Admission: EM | Disposition: A | Discharge status: Home / Self Care | Payer: Self-pay | Source: Home / Self Care | Attending: Internal Medicine

## 2022-06-16 DIAGNOSIS — K8042 Calculus of bile duct with acute cholecystitis without obstruction: Secondary | ICD-10-CM | POA: Diagnosis not present

## 2022-06-16 LAB — CBC
HCT: 38.5 % (ref 36.0–46.0)
Hemoglobin: 12.6 g/dL (ref 12.0–15.0)
MCH: 29.7 pg (ref 26.0–34.0)
MCHC: 32.7 g/dL (ref 30.0–36.0)
MCV: 90.8 fL (ref 80.0–100.0)
Platelets: 225 10*3/uL (ref 150–400)
RBC: 4.24 MIL/uL (ref 3.87–5.11)
RDW: 12.7 % (ref 11.5–15.5)
WBC: 11.2 10*3/uL — ABNORMAL HIGH (ref 4.0–10.5)
nRBC: 0 % (ref 0.0–0.2)

## 2022-06-16 LAB — HEPATIC FUNCTION PANEL
ALT: 38 U/L (ref 0–44)
AST: 56 U/L — ABNORMAL HIGH (ref 15–41)
Albumin: 2.8 g/dL — ABNORMAL LOW (ref 3.5–5.0)
Alkaline Phosphatase: 50 U/L (ref 38–126)
Bilirubin, Direct: 0.2 mg/dL (ref 0.0–0.2)
Indirect Bilirubin: 0.5 mg/dL (ref 0.3–0.9)
Total Bilirubin: 0.7 mg/dL (ref 0.3–1.2)
Total Protein: 5.7 g/dL — ABNORMAL LOW (ref 6.5–8.1)

## 2022-06-16 LAB — BASIC METABOLIC PANEL
Anion gap: 6 (ref 5–15)
BUN: 15 mg/dL (ref 8–23)
CO2: 28 mmol/L (ref 22–32)
Calcium: 7.9 mg/dL — ABNORMAL LOW (ref 8.9–10.3)
Chloride: 106 mmol/L (ref 98–111)
Creatinine, Ser: 0.66 mg/dL (ref 0.44–1.00)
GFR, Estimated: >60 mL/min (ref >60)
Glucose, Bld: 124 mg/dL — ABNORMAL HIGH (ref 70–99)
Potassium: 3.5 mmol/L (ref 3.5–5.1)
Sodium: 140 mmol/L (ref 135–145)

## 2022-06-16 SURGERY — ERCP, WITH INTERVENTION IF INDICATED
Anesthesia: General

## 2022-06-16 MED ORDER — OXYCODONE HCL 5 MG PO TABS: 5.0000 mg | ORAL_TABLET | ORAL | 0 refills | Status: AC | PRN

## 2022-06-16 NOTE — Progress Notes (Signed)
Mobility Specialist Cancellation/Refusal Note:    06/16/22 1158  Mobility  Activity Refused mobility     Reason for Cancellation/Refusal: Pt declined mobility at this time. Pt already walked with son earlier. Will check back as schedule permits.     [employer]

## 2022-06-16 NOTE — Progress Notes (Signed)
Cholecystectomy note reviewed (gangrenous GB, unable to do IOC).  LFTs only minimally elevated.  Would not pursue ERCP at this point.  [facility]  will revisit Monday; please call us back beforehand if needed.

## 2022-06-16 NOTE — Progress Notes (Signed)
PROGRESS NOTE    Amy Finley  TXM:[id number] DOB: 1941/08/26 DOA: 06/13/2022 PCP: Amy Noble, MD    Brief Narrative:  Amy Finley is a 81 y.o. female with medical history significant of coronary artery disease status post stenting and subsequent emergent CABG for left main dissection in 2011 who presents to the emergency department due to abdominal pain which started this morning.  She thought it was due to gallstones since she had similar symptoms last year that subsequently resolved and did not do any surgery at that time. S/p cholecystectomy on 9/22.   Assessment and Plan: Abdominal pain, nausea and vomiting due acute cholecystitis -cholecystectomy 9/22 -trend LFTs --no need for ERCP at this point -ambulate -advance diet as able   Hypokalemia possibly due to vomiting -repleted   Elevated troponin possibly due to type II demand ischemia Cardiology was consulted and felt that this was due to demand ischemia -echo done   Prolonged QT interval QTc 515m -repeat EKG   CAD s/p STEMI with prior bypass Stable, patient was on aspirin No antihyperlipidemic drug noted in med rec     DVT prophylaxis: SCDs Start: 06/13/22 2347    Code Status: Full Code   Disposition Plan:  Level of care: Med-Surg Status is: Inpatient Remains inpatient appropriate because: ambulate, home in AM    Consultants:  GI GS   Subjective: No flatus or BM Is walking in hallway  Objective: Vitals:   06/15/22 2136 06/16/22 0128 06/16/22 0519 06/16/22 0957  BP: (!) 126/46 (!) 121/54 125/60 (!) 137/58  Pulse: 87 77 74 84  Resp: '18 18 18   '$ Temp: 97.9 F (36.6 C) 98.4 F (36.9 C) 98.2 F (36.8 C) 97.7 F (36.5 C)  TempSrc: Oral Oral Oral Oral  SpO2: 93% 94% 95% 93%  Weight:      Height:        Intake/Output Summary (Last 24 hours) at 06/16/2022 1113 Last data filed at 06/16/2022 1000 Gross per 24 hour  Intake 1385.18 ml  Output 875 ml  Net 510.18 ml   Filed Weights   06/15/22  1111  Weight: 67.1 kg    Examination:   General: Appearance:     Overweight female in no acute distress     Lungs:      respirations unlabored  Heart:    Normal heart rate.    MS:   All extremities are intact.    Neurologic:   Awake, alert       Data Reviewed: I have personally reviewed following labs and imaging studies  CBC: Recent Labs  Lab 06/13/22 1208 06/14/22 0501 06/15/22 0439 06/16/22 0443  WBC 14.7* 14.7* 11.9* 11.2*  NEUTROABS 12.6*  --   --   --   HGB 14.9 14.5 13.2 12.6  HCT 43.9 44.5 40.9 38.5  MCV 87.5 91.4 91.1 90.8  PLT 250 247 214 2482  Basic Metabolic Panel: Recent Labs  Lab 06/13/22 1208 06/14/22 0501 06/15/22 0439 06/16/22 0443  NA 137 139 140 140  K 2.9* 3.9 3.1* 3.5  CL 99 105 106 106  CO2 '29 30 27 28  '$ GLUCOSE 152* 113* 101* 124*  BUN '12 11 10 15  '$ CREATININE 0.63 0.63 0.49 0.66  CALCIUM 8.7* 8.2* 7.8* 7.9*  MG  --  2.0  --   --   PHOS  --  2.6  --   --    GFR: Estimated Creatinine Clearance: 51.2 mL/min (by C-G formula based on SCr of 0.66  mg/dL). Liver Function Tests: Recent Labs  Lab 06/13/22 1208 06/14/22 0501 06/15/22 0439 06/16/22 0443  AST '29 25 18 '$ 56*  ALT '23 19 16 '$ 38  ALKPHOS 68 60 56 50  BILITOT 0.9 1.1 1.1 0.7  PROT 7.1 6.1* 5.7* 5.7*  ALBUMIN 4.0 3.4* 3.1* 2.8*   Recent Labs  Lab 06/13/22 1208  LIPASE 26   No results for input(s): "AMMONIA" in the last 168 hours. Coagulation Profile: No results for input(s): "INR", "PROTIME" in the last 168 hours. Cardiac Enzymes: No results for input(s): "CKTOTAL", "CKMB", "CKMBINDEX", "TROPONINI" in the last 168 hours. BNP (last 3 results) No results for input(s): "PROBNP" in the last 8760 hours. HbA1C: No results for input(s): "HGBA1C" in the last 72 hours. CBG: No results for input(s): "GLUCAP" in the last 168 hours. Lipid Profile: No results for input(s): "CHOL", "HDL", "LDLCALC", "TRIG", "CHOLHDL", "LDLDIRECT" in the last 72 hours. Thyroid Function  Tests: No results for input(s): "TSH", "T4TOTAL", "FREET4", "T3FREE", "THYROIDAB" in the last 72 hours. Anemia Panel: No results for input(s): "VITAMINB12", "FOLATE", "FERRITIN", "TIBC", "IRON", "RETICCTPCT" in the last 72 hours. Sepsis Labs: No results for input(s): "PROCALCITON", "LATICACIDVEN" in the last 168 hours.  Recent Results (from the past 240 hour(s))  Surgical pcr screen     Status: None   Collection Time: 06/15/22  5:40 AM   Specimen: Nasal Mucosa; Nasal Swab  Result Value Ref Range Status   MRSA, PCR NEGATIVE NEGATIVE Final   Staphylococcus aureus NEGATIVE NEGATIVE Final    Comment: (NOTE) The Xpert SA Assay (FDA approved for NASAL specimens in patients 17 years of age and older), is one component of a comprehensive surveillance program. It is not intended to diagnose infection nor to guide or monitor treatment. Performed at [facility], [place] [address], [place], [place] [address]          Radiology Studies: ECHOCARDIOGRAM COMPLETE  Result Date: 06/14/2022    ECHOCARDIOGRAM REPORT   Patient Name:   Amy Finley Date of Exam: 06/14/2022 Medical Rec #:  [id number]      Height:       63.0 in Accession #:    [id number]     Weight:       150.0 lb Date of Birth:  Nov 17, 1940      BSA:          32.711 m Patient Age:    39 years       BP:           133/56 mmHg Patient Gender: F              HR:           80 bpm. Exam Location:  Inpatient Procedure: 2D Echo and Intracardiac Opacification Agent Indications:    Abnormal ECG  History:        Patient has prior history of Echocardiogram examinations, most                 recent 06/30/2010. CAD and Previous Myocardial Infarction,                 Signs/Symptoms:Chest Pain; Risk Factors:Dyslipidemia.  Sonographer:    Harvie Junior Referring Phys: [id number] OLADAPO ADEFESO  Sonographer Comments: Technically difficult study due to poor echo windows. Image acquisition challenging due to respiratory motion. IMPRESSIONS  1. Left  ventricular ejection fraction, by estimation, is 35 to 40%. The left ventricle has moderately decreased function. The left ventricle demonstrates regional wall motion abnormalities (see  scoring diagram/findings for description). Left ventricular  diastolic parameters are consistent with Grade I diastolic dysfunction (impaired relaxation).  2. Right ventricular systolic function is normal. The right ventricular size is normal. There is normal pulmonary artery systolic pressure. The estimated right ventricular systolic pressure is 8.8 mmHg.  3. The mitral valve is grossly normal. No evidence of mitral valve regurgitation. No evidence of mitral stenosis.  4. The aortic valve is tricuspid. Aortic valve regurgitation is not visualized. No aortic stenosis is present.  5. The inferior vena cava is normal in size with greater than 50% respiratory variability, suggesting right atrial pressure of 3 mmHg. Conclusion(s)/Recommendation(s): Findings consistent with ischemic cardiomyopathy. FINDINGS  Left Ventricle: Left ventricular ejection fraction, by estimation, is 35 to 40%. The left ventricle has moderately decreased function. The left ventricle demonstrates regional wall motion abnormalities. Definity contrast agent was given IV to delineate the left ventricular endocardial borders. The left ventricular internal cavity size was normal in size. There is no left ventricular hypertrophy. Left ventricular diastolic parameters are consistent with Grade I diastolic dysfunction (impaired relaxation).  LV Wall Scoring: The mid and distal anterior septum, apical anterior segment, and apex are akinetic. Right Ventricle: The right ventricular size is normal. No increase in right ventricular wall thickness. Right ventricular systolic function is normal. There is normal pulmonary artery systolic pressure. The tricuspid regurgitant velocity is 1.20 m/s, and  with an assumed right atrial pressure of 3 mmHg, the estimated right ventricular  systolic pressure is 8.8 mmHg. Left Atrium: Left atrial size was normal in size. Right Atrium: Right atrial size was normal in size. Pericardium: There is no evidence of pericardial effusion. Mitral Valve: The mitral valve is grossly normal. No evidence of mitral valve regurgitation. No evidence of mitral valve stenosis. Tricuspid Valve: The tricuspid valve is grossly normal. Tricuspid valve regurgitation is not demonstrated. No evidence of tricuspid stenosis. Aortic Valve: The aortic valve is tricuspid. Aortic valve regurgitation is not visualized. No aortic stenosis is present. Aortic valve mean gradient measures 4.5 mmHg. Aortic valve peak gradient measures 8.4 mmHg. Aortic valve area, by VTI measures 2.13 cm. Pulmonic Valve: The pulmonic valve was grossly normal. Pulmonic valve regurgitation is not visualized. No evidence of pulmonic stenosis. Aorta: The aortic root and ascending aorta are structurally normal, with no evidence of dilitation. Venous: The inferior vena cava is normal in size with greater than 50% respiratory variability, suggesting right atrial pressure of 3 mmHg. IAS/Shunts: The atrial septum is grossly normal.  LEFT VENTRICLE PLAX 2D LVIDd:         4.80 cm      Diastology LVIDs:         3.50 cm      LV e' medial:    5.98 cm/s LV PW:         1.00 cm      LV E/e' medial:  9.8 LV IVS:        1.00 cm      LV e' lateral:   4.68 cm/s LVOT diam:     2.00 cm      LV E/e' lateral: 12.5 LV SV:         49 LV SV Index:   29 LVOT Area:     3.14 cm  LV Volumes (MOD) LV vol d, MOD A2C: 95.5 ml LV vol d, MOD A4C: 110.0 ml LV vol s, MOD A2C: 57.0 ml LV vol s, MOD A4C: 69.1 ml LV SV MOD A2C:     38.5  ml LV SV MOD A4C:     110.0 ml LV SV MOD BP:      42.1 ml RIGHT VENTRICLE RV S prime:     10.50 cm/s TAPSE (M-mode): 1.6 cm LEFT ATRIUM             Index        RIGHT ATRIUM           Index LA diam:        3.30 cm 1.93 cm/m   RA Area:     11.70 cm LA Vol (A2C):   34.8 ml 20.34 ml/m  RA Volume:   25.70 ml  15.02  ml/m LA Vol (A4C):   25.0 ml 14.61 ml/m LA Biplane Vol: 29.6 ml 17.30 ml/m  AORTIC VALVE                    PULMONIC VALVE AV Area (Vmax):    2.13 cm     PV Vmax:       1.05 m/s AV Area (Vmean):   2.06 cm     PV Peak grad:  4.4 mmHg AV Area (VTI):     2.13 cm AV Vmax:           145.00 cm/s AV Vmean:          96.000 cm/s AV VTI:            0.232 m AV Peak Grad:      8.4 mmHg AV Mean Grad:      4.5 mmHg LVOT Vmax:         98.50 cm/s LVOT Vmean:        63.000 cm/s LVOT VTI:          0.157 m LVOT/AV VTI ratio: 0.68  AORTA Ao Root diam: 3.10 cm Ao Asc diam:  3.10 cm MITRAL VALVE               TRICUSPID VALVE MV Area (PHT): 3.48 cm    TR Peak grad:   5.8 mmHg MV Decel Time: 218 msec    TR Vmax:        120.00 cm/s MR Peak grad: 9.5 mmHg MR Vmax:      154.00 cm/s  SHUNTS MV E velocity: 58.70 cm/s  Systemic VTI:  0.16 m MV A velocity: 89.50 cm/s  Systemic Diam: 2.00 cm MV E/A ratio:  0.66 Eleonore Chiquito MD Electronically signed by Eleonore Chiquito MD Signature Date/Time: 06/14/2022/2:08:25 PM    Final         Scheduled Meds:  indomethacin  100 mg Rectal Once   Continuous Infusions:  cefTRIAXone (ROCEPHIN)  IV 1 g (06/15/22 2036)   metronidazole 500 mg (06/16/22 0810)     LOS: 3 days    Time spent: 45 minutes spent on chart review, discussion with nursing staff, consultants, updating family and interview/physical exam; more than 50% of that time was spent in counseling and/or coordination of care.    Geradine Girt, DO [facility] Available via Epic secure chat 7am-7pm After these hours, please refer to coverage provider listed on [email or web address] 06/16/2022, 11:13 AM

## 2022-06-16 NOTE — Progress Notes (Signed)
1 Day Post-Op   Subjective/Chief Complaint: Doing great, just started clears, up in chair   Objective: Vital signs in last 24 hours: Temp:  [97.4 F (36.3 C)-98.9 F (37.2 C)] 97.7 F (36.5 C) (09/23 0957) Pulse Rate:  [71-100] 84 (09/23 0957) Resp:  [11-29] 18 (09/23 0519) BP: (121-182)/(46-109) 137/58 (09/23 0957) SpO2:  [92 %-100 %] 93 % (09/23 0957) Weight:  [67.1 kg] 67.1 kg (09/22 1111) Last BM Date : 06/15/22  Intake/Output from previous day: 09/22 0701 - 09/23 0700 In: 985.2 [P.O.:240; I.V.:146.3; IV Piggyback:598.9] Out: 976 [Urine:750; Emesis/NG output:200; Stool:1; Blood:25] Intake/Output this shift: Total I/O In: -  Out: 200 [Urine:200]  Ab soft approp tender incisions clean  Lab Results:  Recent Labs    06/15/22 0439 06/16/22 0443  WBC 11.9* 11.2*  HGB 13.2 12.6  HCT 40.9 38.5  PLT 214 225   BMET Recent Labs    06/15/22 0439 06/16/22 0443  NA 140 140  K 3.1* 3.5  CL 106 106  CO2 27 28  GLUCOSE 101* 124*  BUN 10 15  CREATININE 0.49 0.66  CALCIUM 7.8* 7.9*   PT/INR No results for input(s): "LABPROT", "INR" in the last 72 hours. ABG No results for input(s): "PHART", "HCO3" in the last 72 hours.  Invalid input(s): "PCO2", "PO2"  Studies/Results: ECHOCARDIOGRAM COMPLETE  Result Date: 06/14/2022    ECHOCARDIOGRAM REPORT   Patient Name:   Amy Finley Date of Exam: 06/14/2022 Medical Rec #:  [id number]      Height:       63.0 in Accession #:    [id number]     Weight:       150.0 lb Date of Birth:  [birth year]      BSA:          12.711 m Patient Age:    81 years       BP:           133/56 mmHg Patient Gender: F              HR:           80 bpm. Exam Location:  Inpatient Procedure: 2D Echo and Intracardiac Opacification Agent Indications:    Abnormal ECG  History:        Patient has prior history of Echocardiogram examinations, most                 recent 06/30/2010. CAD and Previous Myocardial Infarction,                 Signs/Symptoms:Chest Pain;  Risk Factors:Dyslipidemia.  Sonographer:    Harvie Junior Referring Phys: [id number] OLADAPO ADEFESO  Sonographer Comments: Technically difficult study due to poor echo windows. Image acquisition challenging due to respiratory motion. IMPRESSIONS  1. Left ventricular ejection fraction, by estimation, is 35 to 40%. The left ventricle has moderately decreased function. The left ventricle demonstrates regional wall motion abnormalities (see scoring diagram/findings for description). Left ventricular  diastolic parameters are consistent with Grade I diastolic dysfunction (impaired relaxation).  2. Right ventricular systolic function is normal. The right ventricular size is normal. There is normal pulmonary artery systolic pressure. The estimated right ventricular systolic pressure is 8.8 mmHg.  3. The mitral valve is grossly normal. No evidence of mitral valve regurgitation. No evidence of mitral stenosis.  4. The aortic valve is tricuspid. Aortic valve regurgitation is not visualized. No aortic stenosis is present.  5. The inferior vena cava is normal in size with greater than  50% respiratory variability, suggesting right atrial pressure of 3 mmHg. Conclusion(s)/Recommendation(s): Findings consistent with ischemic cardiomyopathy. FINDINGS  Left Ventricle: Left ventricular ejection fraction, by estimation, is 35 to 40%. The left ventricle has moderately decreased function. The left ventricle demonstrates regional wall motion abnormalities. Definity contrast agent was given IV to delineate the left ventricular endocardial borders. The left ventricular internal cavity size was normal in size. There is no left ventricular hypertrophy. Left ventricular diastolic parameters are consistent with Grade I diastolic dysfunction (impaired relaxation).  LV Wall Scoring: The mid and distal anterior septum, apical anterior segment, and apex are akinetic. Right Ventricle: The right ventricular size is normal. No increase in right  ventricular wall thickness. Right ventricular systolic function is normal. There is normal pulmonary artery systolic pressure. The tricuspid regurgitant velocity is 1.20 m/s, and  with an assumed right atrial pressure of 3 mmHg, the estimated right ventricular systolic pressure is 8.8 mmHg. Left Atrium: Left atrial size was normal in size. Right Atrium: Right atrial size was normal in size. Pericardium: There is no evidence of pericardial effusion. Mitral Valve: The mitral valve is grossly normal. No evidence of mitral valve regurgitation. No evidence of mitral valve stenosis. Tricuspid Valve: The tricuspid valve is grossly normal. Tricuspid valve regurgitation is not demonstrated. No evidence of tricuspid stenosis. Aortic Valve: The aortic valve is tricuspid. Aortic valve regurgitation is not visualized. No aortic stenosis is present. Aortic valve mean gradient measures 4.5 mmHg. Aortic valve peak gradient measures 8.4 mmHg. Aortic valve area, by VTI measures 2.13 cm. Pulmonic Valve: The pulmonic valve was grossly normal. Pulmonic valve regurgitation is not visualized. No evidence of pulmonic stenosis. Aorta: The aortic root and ascending aorta are structurally normal, with no evidence of dilitation. Venous: The inferior vena cava is normal in size with greater than 50% respiratory variability, suggesting right atrial pressure of 3 mmHg. IAS/Shunts: The atrial septum is grossly normal.  LEFT VENTRICLE PLAX 2D LVIDd:         4.80 cm      Diastology LVIDs:         3.50 cm      LV e' medial:    5.98 cm/s LV PW:         1.00 cm      LV E/e' medial:  9.8 LV IVS:        1.00 cm      LV e' lateral:   4.68 cm/s LVOT diam:     2.00 cm      LV E/e' lateral: 12.5 LV SV:         49 LV SV Index:   29 LVOT Area:     3.14 cm  LV Volumes (MOD) LV vol d, MOD A2C: 95.5 ml LV vol d, MOD A4C: 110.0 ml LV vol s, MOD A2C: 57.0 ml LV vol s, MOD A4C: 69.1 ml LV SV MOD A2C:     38.5 ml LV SV MOD A4C:     110.0 ml LV SV MOD BP:      42.1  ml RIGHT VENTRICLE RV S prime:     10.50 cm/s TAPSE (M-mode): 1.6 cm LEFT ATRIUM             Index        RIGHT ATRIUM           Index LA diam:        3.30 cm 1.93 cm/m   RA Area:     11.70 cm LA Vol (  A2C):   34.8 ml 20.34 ml/m  RA Volume:   25.70 ml  15.02 ml/m LA Vol (A4C):   25.0 ml 14.61 ml/m LA Biplane Vol: 29.6 ml 17.30 ml/m  AORTIC VALVE                    PULMONIC VALVE AV Area (Vmax):    2.13 cm     PV Vmax:       1.05 m/s AV Area (Vmean):   2.06 cm     PV Peak grad:  4.4 mmHg AV Area (VTI):     2.13 cm AV Vmax:           145.00 cm/s AV Vmean:          96.000 cm/s AV VTI:            0.232 m AV Peak Grad:      8.4 mmHg AV Mean Grad:      4.5 mmHg LVOT Vmax:         98.50 cm/s LVOT Vmean:        63.000 cm/s LVOT VTI:          0.157 m LVOT/AV VTI ratio: 0.68  AORTA Ao Root diam: 3.10 cm Ao Asc diam:  3.10 cm MITRAL VALVE               TRICUSPID VALVE MV Area (PHT): 3.48 cm    TR Peak grad:   5.8 mmHg MV Decel Time: 218 msec    TR Vmax:        120.00 cm/s MR Peak grad: 9.5 mmHg MR Vmax:      154.00 cm/s  SHUNTS MV E velocity: 58.70 cm/s  Systemic VTI:  0.16 m MV A velocity: 89.50 cm/s  Systemic Diam: 2.00 cm MV E/A ratio:  0.66 Eleonore Chiquito MD Electronically signed by Eleonore Chiquito MD Signature Date/Time: 06/14/2022/2:08:25 PM    Final     Anti-infectives: Anti-infectives (From admission, onward)    Start     Dose/Rate Route Frequency Ordered Stop   06/14/22 2000  cefTRIAXone (ROCEPHIN) 1 g in sodium chloride 0.9 % 100 mL IVPB        1 g 200 mL/hr over 30 Minutes Intravenous Every 24 hours 06/13/22 2142     06/13/22 2000  cefTRIAXone (ROCEPHIN) 1 g in sodium chloride 0.9 % 100 mL IVPB        1 g 200 mL/hr over 30 Minutes Intravenous  Once 06/13/22 1950 06/13/22 2029   06/13/22 2000  metroNIDAZOLE (FLAGYL) IVPB 500 mg        500 mg 100 mL/hr over 60 Minutes Intravenous Every 12 hours 06/13/22 1950         Assessment/Plan: POD 1 lap chole -recheck lfts in am, dont think needs  ercp -home in am if doing well -will sign off as discussed with Dr Nichola Sizer 06/16/2022

## 2022-06-17 DIAGNOSIS — K8042 Calculus of bile duct with acute cholecystitis without obstruction: Secondary | ICD-10-CM | POA: Diagnosis not present

## 2022-06-17 LAB — COMPREHENSIVE METABOLIC PANEL
ALT: 33 U/L (ref 0–44)
AST: 36 U/L (ref 15–41)
Albumin: 2.6 g/dL — ABNORMAL LOW (ref 3.5–5.0)
Alkaline Phosphatase: 44 U/L (ref 38–126)
Anion gap: 4 — ABNORMAL LOW (ref 5–15)
BUN: 14 mg/dL (ref 8–23)
CO2: 30 mmol/L (ref 22–32)
Calcium: 7.7 mg/dL — ABNORMAL LOW (ref 8.9–10.3)
Chloride: 105 mmol/L (ref 98–111)
Creatinine, Ser: 0.51 mg/dL (ref 0.44–1.00)
GFR, Estimated: >60 mL/min (ref >60)
Glucose, Bld: 97 mg/dL (ref 70–99)
Potassium: 3 mmol/L — ABNORMAL LOW (ref 3.5–5.1)
Sodium: 139 mmol/L (ref 135–145)
Total Bilirubin: 0.6 mg/dL (ref 0.3–1.2)
Total Protein: 5 g/dL — ABNORMAL LOW (ref 6.5–8.1)

## 2022-06-17 LAB — MAGNESIUM: Magnesium: 1.9 mg/dL (ref 1.7–2.4)

## 2022-06-17 MED ORDER — POTASSIUM CHLORIDE CRYS ER 20 MEQ PO TBCR
40.0000 meq | EXTENDED_RELEASE_TABLET | ORAL | Status: AC
  Administered 2022-06-17 (×2): 40 meq via ORAL
  Filled 2022-06-17 (×2): qty 2

## 2022-06-17 NOTE — Discharge Summary (Signed)
Physician Discharge Summary  Amy Finley LNL:[id number] DOB: 17-Jun-1941 DOA: 06/13/2022  PCP: [name], MD  Admit date: 06/13/2022 Discharge date: 06/17/2022  Admitted From: home Discharge disposition: home   Recommendations for Outpatient Follow-Up:   LFTs at next office visit [facility] follow up   Discharge Diagnosis:   Principal Problem:   Choledocholithiasis with acute cholecystitis Active Problems:   CAD (coronary artery disease)   Abdominal pain   Nausea & vomiting   Hypokalemia   Elevated troponin   Prolonged QT interval    Discharge Condition: Improved.  Diet recommendation: soft  Wound care: None.  Code status: Full.   History of Present Illness:   Amy Finley is a 82 y.o. female with medical history significant of coronary artery disease status post stenting and subsequent emergent CABG for left main dissection in 2011 who presents to the emergency department due to abdominal pain which started this morning.  Patient complained of nausea and vomiting which started yesterday, she states that she had a chocolate cookie in the afternoon yesterday and shortly after this, she felt sick to her stomach and vomited, she had other episodes of vomiting after trying to eat, this was followed by generalized weakness and decreased appetite.  On waking up this morning, she developed abdominal pain in the right upper quadrant which was rated as 6/10 on pain scale.  She thought it was due to gallstones since she had similar symptoms last year that subsequently resolved and did not do any surgery at that time.  She called her PCP who asked her to go to the ED for further evaluation and management. She denies fever, chills, chest pain, shortness of breath, blurry vision   Hospital Course by Problem:   Abdominal pain, nausea and vomiting due acute cholecystitis -cholecystectomy 9/22 -LFTs negative--no need for ERCP at this point -ambulate -advance diet as able    Hypokalemia possibly due to vomiting -repleted   Elevated troponin possibly due to type II demand ischemia Cardiology was consulted and felt that this was due to demand ischemia -echo done and no follow up needed   Prolonged QT interval QTc 562m -repeat EKG normal   CAD s/p STEMI with prior bypass Stable, patient was on aspirin     Medical Consultants:   [facility]   Discharge Exam:   Vitals:   06/16/22 2210 06/17/22 0549  BP: 131/61 (!) 122/58  Pulse: 86 86  Resp: 18 18  Temp: 97.9 F (36.6 C) 98.7 F (37.1 C)  SpO2: 94% 93%   Vitals:   06/16/22 0519 06/16/22 0957 06/16/22 2210 06/17/22 0549  BP: 125/60 (!) 137/58 131/61 (!) 122/58  Pulse: 74 84 86 86  Resp: '18  18 18  '$ Temp: 98.2 F (36.8 C) 97.7 F (36.5 C) 97.9 F (36.6 C) 98.7 F (37.1 C)  TempSrc: Oral Oral Oral Oral  SpO2: 95% 93% 94% 93%  Weight:      Height:        General exam: Appears calm and comfortable.    The results of significant diagnostics from this hospitalization (including imaging, microbiology, ancillary and laboratory) are listed below for reference.     Procedures and Diagnostic Studies:   ECHOCARDIOGRAM COMPLETE  Result Date: 06/14/2022    ECHOCARDIOGRAM REPORT   Patient Name:   GNYNA CHILTONDate of Exam: 06/14/2022 Medical Rec #:  [id number]     Height:       63.0 in Accession #:  [id number]     Weight:       150.0 lb Date of Birth:  09-27-1940      BSA:          1.711 m Patient Age:    63 years       BP:           133/56 mmHg Patient Gender: F              HR:           80 bpm. Exam Location:  Inpatient Procedure: 2D Echo and Intracardiac Opacification Agent Indications:    Abnormal ECG  History:        Patient has prior history of Echocardiogram examinations, most                 recent 06/30/2010. CAD and Previous Myocardial Infarction,                 Signs/Symptoms:Chest Pain; Risk Factors:Dyslipidemia.  Sonographer:    Harvie Junior Referring Phys: [id number] OLADAPO ADEFESO   Sonographer Comments: Technically difficult study due to poor echo windows. Image acquisition challenging due to respiratory motion. IMPRESSIONS  1. Left ventricular ejection fraction, by estimation, is 35 to 40%. The left ventricle has moderately decreased function. The left ventricle demonstrates regional wall motion abnormalities (see scoring diagram/findings for description). Left ventricular  diastolic parameters are consistent with Grade I diastolic dysfunction (impaired relaxation).  2. Right ventricular systolic function is normal. The right ventricular size is normal. There is normal pulmonary artery systolic pressure. The estimated right ventricular systolic pressure is 8.8 mmHg.  3. The mitral valve is grossly normal. No evidence of mitral valve regurgitation. No evidence of mitral stenosis.  4. The aortic valve is tricuspid. Aortic valve regurgitation is not visualized. No aortic stenosis is present.  5. The inferior vena cava is normal in size with greater than 50% respiratory variability, suggesting right atrial pressure of 3 mmHg. Conclusion(s)/Recommendation(s): Findings consistent with ischemic cardiomyopathy. FINDINGS  Left Ventricle: Left ventricular ejection fraction, by estimation, is 35 to 40%. The left ventricle has moderately decreased function. The left ventricle demonstrates regional wall motion abnormalities. Definity contrast agent was given IV to delineate the left ventricular endocardial borders. The left ventricular internal cavity size was normal in size. There is no left ventricular hypertrophy. Left ventricular diastolic parameters are consistent with Grade I diastolic dysfunction (impaired relaxation).  LV Wall Scoring: The mid and distal anterior septum, apical anterior segment, and apex are akinetic. Right Ventricle: The right ventricular size is normal. No increase in right ventricular wall thickness. Right ventricular systolic function is normal. There is normal pulmonary  artery systolic pressure. The tricuspid regurgitant velocity is 1.20 m/s, and  with an assumed right atrial pressure of 3 mmHg, the estimated right ventricular systolic pressure is 8.8 mmHg. Left Atrium: Left atrial size was normal in size. Right Atrium: Right atrial size was normal in size. Pericardium: There is no evidence of pericardial effusion. Mitral Valve: The mitral valve is grossly normal. No evidence of mitral valve regurgitation. No evidence of mitral valve stenosis. Tricuspid Valve: The tricuspid valve is grossly normal. Tricuspid valve regurgitation is not demonstrated. No evidence of tricuspid stenosis. Aortic Valve: The aortic valve is tricuspid. Aortic valve regurgitation is not visualized. No aortic stenosis is present. Aortic valve mean gradient measures 4.5 mmHg. Aortic valve peak gradient measures 8.4 mmHg. Aortic valve area, by VTI measures 2.13 cm. Pulmonic Valve: The pulmonic valve was grossly normal. Pulmonic  valve regurgitation is not visualized. No evidence of pulmonic stenosis. Aorta: The aortic root and ascending aorta are structurally normal, with no evidence of dilitation. Venous: The inferior vena cava is normal in size with greater than 50% respiratory variability, suggesting right atrial pressure of 3 mmHg. IAS/Shunts: The atrial septum is grossly normal.  LEFT VENTRICLE PLAX 2D LVIDd:         4.80 cm      Diastology LVIDs:         3.50 cm      LV e' medial:    5.98 cm/s LV PW:         1.00 cm      LV E/e' medial:  9.8 LV IVS:        1.00 cm      LV e' lateral:   4.68 cm/s LVOT diam:     2.00 cm      LV E/e' lateral: 12.5 LV SV:         49 LV SV Index:   29 LVOT Area:     3.14 cm  LV Volumes (MOD) LV vol d, MOD A2C: 95.5 ml LV vol d, MOD A4C: 110.0 ml LV vol s, MOD A2C: 57.0 ml LV vol s, MOD A4C: 69.1 ml LV SV MOD A2C:     38.5 ml LV SV MOD A4C:     110.0 ml LV SV MOD BP:      42.1 ml RIGHT VENTRICLE RV S prime:     10.50 cm/s TAPSE (M-mode): 1.6 cm LEFT ATRIUM             Index         RIGHT ATRIUM           Index LA diam:        3.30 cm 1.93 cm/m   RA Area:     11.70 cm LA Vol (A2C):   34.8 ml 20.34 ml/m  RA Volume:   25.70 ml  15.02 ml/m LA Vol (A4C):   25.0 ml 14.61 ml/m LA Biplane Vol: 29.6 ml 17.30 ml/m  AORTIC VALVE                    PULMONIC VALVE AV Area (Vmax):    2.13 cm     PV Vmax:       1.05 m/s AV Area (Vmean):   2.06 cm     PV Peak grad:  4.4 mmHg AV Area (VTI):     2.13 cm AV Vmax:           145.00 cm/s AV Vmean:          96.000 cm/s AV VTI:            0.232 m AV Peak Grad:      8.4 mmHg AV Mean Grad:      4.5 mmHg LVOT Vmax:         98.50 cm/s LVOT Vmean:        63.000 cm/s LVOT VTI:          0.157 m LVOT/AV VTI ratio: 0.68  AORTA Ao Root diam: 3.10 cm Ao Asc diam:  3.10 cm MITRAL VALVE               TRICUSPID VALVE MV Area (PHT): 3.48 cm    TR Peak grad:   5.8 mmHg MV Decel Time: 218 msec    TR Vmax:        120.00 cm/s MR Peak  grad: 9.5 mmHg MR Vmax:      154.00 cm/s  SHUNTS MV E velocity: 58.70 cm/s  Systemic VTI:  0.16 m MV A velocity: 89.50 cm/s  Systemic Diam: 2.00 cm MV E/A ratio:  0.66 Eleonore Chiquito MD Electronically signed by Eleonore Chiquito MD Signature Date/Time: 06/14/2022/2:08:25 PM    Final    MR ABDOMEN MRCP W WO CONTAST  Result Date: 06/13/2022 CLINICAL DATA:  Cholelithiasis RIGHT upper quadrant pain. EXAM: MRI ABDOMEN WITHOUT AND WITH CONTRAST (INCLUDING MRCP) TECHNIQUE: Multiplanar multisequence MR imaging of the abdomen was performed both before and after the administration of intravenous contrast. Heavily T2-weighted images of the biliary and pancreatic ducts were obtained, and three-dimensional MRCP images were rendered by post processing. CONTRAST:  28m GADAVIST GADOBUTROL 1 MMOL/ML IV SOLN COMPARISON:  CT 06/13/2022 FINDINGS: Lower chest:  Lung bases are clear. Hepatobiliary: Multiple gallstones layer within the lumen the gallbladder. There is moderate volume of Peri cholecystic fluid and pericholecystic inflammation (image 21/series 5). There  are numerous stones which measure proximally 3 mm each. There is mild intrahepatic biliary duct dilatation. The common hepatic duct measures 7 mm. The common bile duct measures 7 mm. There is a subtle signal void in the distal common bile duct measuring 6 mm on image 12/series 4. No discrete hepatic lesion Pancreas: Normal pancreatic parenchymal intensity. No ductal dilatation or inflammation. Spleen: Normal spleen. Adrenals/urinary tract: Adrenal glands and kidneys are normal. Stomach/Bowel: Stomach and limited of the small bowel is unremarkable Vascular/Lymphatic: Abdominal aortic normal caliber. No retroperitoneal periportal lymphadenopathy. Musculoskeletal: No aggressive osseous lesion IMPRESSION: 1. Signal void in the distal common bile duct just above the ampulla is most consistent with choledocholithiasis. 2. Mild intrahepatic and extrahepatic biliary duct dilatation. 3. Multiple gallstones and pericholecystic fluid consistent with acute cholecystitis. Electronically Signed   By: SSuzy BouchardM.D.   On: 06/13/2022 17:10   MR 3D Recon At Scanner  Result Date: 06/13/2022 CLINICAL DATA:  Cholelithiasis RIGHT upper quadrant pain. EXAM: MRI ABDOMEN WITHOUT AND WITH CONTRAST (INCLUDING MRCP) TECHNIQUE: Multiplanar multisequence MR imaging of the abdomen was performed both before and after the administration of intravenous contrast. Heavily T2-weighted images of the biliary and pancreatic ducts were obtained, and three-dimensional MRCP images were rendered by post processing. CONTRAST:  721mGADAVIST GADOBUTROL 1 MMOL/ML IV SOLN COMPARISON:  CT 06/13/2022 FINDINGS: Lower chest:  Lung bases are clear. Hepatobiliary: Multiple gallstones layer within the lumen the gallbladder. There is moderate volume of Peri cholecystic fluid and pericholecystic inflammation (image 21/series 5). There are numerous stones which measure proximally 3 mm each. There is mild intrahepatic biliary duct dilatation. The common hepatic  duct measures 7 mm. The common bile duct measures 7 mm. There is a subtle signal void in the distal common bile duct measuring 6 mm on image 12/series 4. No discrete hepatic lesion Pancreas: Normal pancreatic parenchymal intensity. No ductal dilatation or inflammation. Spleen: Normal spleen. Adrenals/urinary tract: Adrenal glands and kidneys are normal. Stomach/Bowel: Stomach and limited of the small bowel is unremarkable Vascular/Lymphatic: Abdominal aortic normal caliber. No retroperitoneal periportal lymphadenopathy. Musculoskeletal: No aggressive osseous lesion IMPRESSION: 1. Signal void in the distal common bile duct just above the ampulla is most consistent with choledocholithiasis. 2. Mild intrahepatic and extrahepatic biliary duct dilatation. 3. Multiple gallstones and pericholecystic fluid consistent with acute cholecystitis. Electronically Signed   By: StSuzy Bouchard.D.   On: 06/13/2022 17:10   USKoreabdomen Limited  Result Date: 06/13/2022 CLINICAL DATA:  RUQ pain EXAM: ULTRASOUND ABDOMEN  LIMITED RIGHT UPPER QUADRANT COMPARISON:  None Available. FINDINGS: Gallbladder: Multiple small layering gallstones with marked thickening of the gallbladder wall. Pericholecystic fluid. Also felt positive sonographic Murphy sign per the technologist. Common bile duct: Diameter: Dilated, measuring 9 mm Liver: No focal lesion identified. Within normal limits in parenchymal echogenicity. Portal vein is patent on color Doppler imaging with normal direction of blood flow towards the liver. Trace perihepatic fluid. IMPRESSION: 1. Findings concerning for acute cholecystitis, detailed above. 2. Dilated common bile duct, raising concern for possible choledocholithiasis. MRCP could further evaluate. Electronically Signed   By: Margaretha Sheffield M.D.   On: 06/13/2022 15:36   DG Chest Portable 1 View  Result Date: 06/13/2022 CLINICAL DATA:  RUQ pain, elevated trop EXAM: PORTABLE CHEST 1 VIEW COMPARISON:  Radiograph  05/31/2017 FINDINGS: Unchanged cardiomediastinal silhouette with prior median sternotomy. No focal airspace consolidation. There is no large pleural effusion. No pneumothorax. IMPRESSION: No evidence of acute cardiopulmonary disease. Electronically Signed   By: Maurine Simmering M.D.   On: 06/13/2022 14:24   CT ABDOMEN PELVIS W CONTRAST  Result Date: 06/13/2022 CLINICAL DATA:  Right upper quadrant pain EXAM: CT ABDOMEN AND PELVIS WITH CONTRAST TECHNIQUE: Multidetector CT imaging of the abdomen and pelvis was performed using the standard protocol following bolus administration of intravenous contrast. RADIATION DOSE REDUCTION: This exam was performed according to the departmental dose-optimization program which includes automated exposure control, adjustment of the mA and/or kV according to patient size and/or use of iterative reconstruction technique. CONTRAST:  141m OMNIPAQUE IOHEXOL 300 MG/ML  SOLN COMPARISON:  None Available. FINDINGS: Lower chest: Bibasilar atelectasis.  Status post median sternotomy. Hepatobiliary: No focal liver lesions are visualized. There is mild intrahepatic biliary ductal dilatation. There is cholelithiasis and gallbladder sludge with marked gallbladder wall thickening measuring up to 7 mm. There is mucosal hyperenhancement. The common bile duct is also slightly enlarged for age measuring up to 9 mm. No definite radiopaque stone is visualized within the common bile duct. Pancreas: Unremarkable. No pancreatic ductal dilatation or surrounding inflammatory changes. Spleen: Normal in size without focal abnormality. Adrenals/Urinary Tract: Adrenal glands are unremarkable. Kidneys are normal, without renal calculi, focal lesion, or hydronephrosis. Urinary bladder is fluid-filled and slightly distended. Stomach/Bowel: Stomach is within normal limits. Appendix appears normal. No evidence of bowel wall thickening, distention, or inflammatory changes. There is diverticulosis without diverticulitis.  Vascular/Lymphatic: No significant vascular findings are present. No enlarged abdominal or pelvic lymph nodes. Reproductive: Status post hysterectomy. No adnexal masses. There is trace fluid in the pelvis. Other: No abdominal wall hernia or abnormality. No abdominopelvic ascites. Musculoskeletal: No acute or significant osseous findings. IMPRESSION: Findings are worrisome for acute cholecystitis. There is also intra and extrahepatic biliary ductal dilatation, which raises the possibility for choledocholithiasis. Electronically Signed   By: HMarin RobertsM.D.   On: 06/13/2022 14:11     Labs:   Basic Metabolic Panel: Recent Labs  Lab 06/13/22 1208 06/14/22 0501 06/15/22 0439 06/16/22 0443 06/17/22 0444  NA 137 139 140 140 139  K 2.9* 3.9 3.1* 3.5 3.0*  CL 99 105 106 106 105  CO2 '29 30 27 28 30  '$ GLUCOSE 152* 113* 101* 124* 97  BUN '12 11 10 15 14  '$ CREATININE 0.63 0.63 0.49 0.66 0.51  CALCIUM 8.7* 8.2* 7.8* 7.9* 7.7*  MG  --  2.0  --   --  1.9  PHOS  --  2.6  --   --   --    GFR Estimated Creatinine Clearance:  51.2 mL/min (by C-G formula based on SCr of 0.51 mg/dL). Liver Function Tests: Recent Labs  Lab 06/13/22 1208 06/14/22 0501 06/15/22 0439 06/16/22 0443 06/17/22 0444  AST '29 25 18 '$ 56* 36  ALT '23 19 16 '$ 38 33  ALKPHOS 68 60 56 50 44  BILITOT 0.9 1.1 1.1 0.7 0.6  PROT 7.1 6.1* 5.7* 5.7* 5.0*  ALBUMIN 4.0 3.4* 3.1* 2.8* 2.6*   Recent Labs  Lab 06/13/22 1208  LIPASE 26   No results for input(s): "AMMONIA" in the last 168 hours. Coagulation profile No results for input(s): "INR", "PROTIME" in the last 168 hours.  CBC: Recent Labs  Lab 06/13/22 1208 06/14/22 0501 06/15/22 0439 06/16/22 0443  WBC 14.7* 14.7* 11.9* 11.2*  NEUTROABS 12.6*  --   --   --   HGB 14.9 14.5 13.2 12.6  HCT 43.9 44.5 40.9 38.5  MCV 87.5 91.4 91.1 90.8  PLT 250 247 214 225   Cardiac Enzymes: No results for input(s): "CKTOTAL", "CKMB", "CKMBINDEX", "TROPONINI" in the last 168  hours. BNP: Invalid input(s): "POCBNP" CBG: No results for input(s): "GLUCAP" in the last 168 hours. D-Dimer No results for input(s): "DDIMER" in the last 72 hours. Hgb A1c No results for input(s): "HGBA1C" in the last 72 hours. Lipid Profile No results for input(s): "CHOL", "HDL", "LDLCALC", "TRIG", "CHOLHDL", "LDLDIRECT" in the last 72 hours. Thyroid function studies No results for input(s): "TSH", "T4TOTAL", "T3FREE", "THYROIDAB" in the last 72 hours.  Invalid input(s): "FREET3" Anemia work up No results for input(s): "VITAMINB12", "FOLATE", "FERRITIN", "TIBC", "IRON", "RETICCTPCT" in the last 72 hours. Microbiology Recent Results (from the past 240 hour(s))  Surgical pcr screen     Status: None   Collection Time: 06/15/22  5:40 AM   Specimen: Nasal Mucosa; Nasal Swab  Result Value Ref Range Status   MRSA, PCR NEGATIVE NEGATIVE Final   Staphylococcus aureus NEGATIVE NEGATIVE Final    Comment: (NOTE) The Xpert SA Assay (FDA approved for NASAL specimens in patients 84 years of age and older), is one component of a comprehensive surveillance program. It is not intended to diagnose infection nor to guide or monitor treatment. Performed at [facility], [place] [address], [place], [place] [address]      Discharge Instructions:   Discharge Instructions     Discharge instructions   Complete by: As directed    Soft diet   Increase activity slowly   Complete by: As directed       Allergies as of 06/17/2022       Reactions   Erythromycin Other (See Comments)   Severe abdominal pain.        Medication List     TAKE these medications    aspirin EC 81 MG tablet Take 81 mg by mouth daily. Swallow whole.   b complex vitamins tablet Take 1 tablet by mouth daily.   chlorpheniramine 4 MG tablet Commonly known as: CHLOR-TRIMETON Take 4 mg by mouth daily.   cholecalciferol 25 MCG (1000 UNIT) tablet Commonly known as: VITAMIN D3 Take 1,000 Units  by mouth daily.   melatonin 5 MG Tabs Take 5 mg by mouth at bedtime as needed (sleep).   oxyCODONE 5 MG immediate release tablet Commonly known as: Oxy IR/ROXICODONE Take 1 tablet (5 mg total) by mouth every 4 (four) hours as needed for moderate pain or severe pain.   QUERCETIN PO Take 800 mg by mouth daily.   THERA TEARS NUTRITION PO Place 1 drop into both eyes  daily.        Follow-up Information     [facility] [place] Follow up on 07/05/2022.   Specialty: General Surgery Why: 07/05/22 at 3:45 pm. Please bring a copy of your photo ID and insurance card. Please arrive 30 minutes prior to your appointment for paperwork. Contact information: [address] [place] [address] [phone]         [name], MD Follow up.   Specialty: Internal Medicine Why: BMP 1 week re: [place] information: [address] [place] [address] [phone]                  Time coordinating discharge: 45 min  Signed:  Geradine Girt DO  [facility] 06/17/2022, 9:43 AM

## 2022-06-18 LAB — SURGICAL PATHOLOGY

## 2022-11-29 ENCOUNTER — Ambulatory Visit
Admission: RE | Admit: 2022-11-29 | Discharge: 2022-11-29 | Disposition: A | Discharge status: Home / Self Care | Payer: Medicare Other | Source: Ambulatory Visit | Attending: Nurse Practitioner | Admitting: Nurse Practitioner

## 2022-11-29 VITALS — BP 148/70 | HR 79 | Temp 97.8°F | Resp 16

## 2022-11-29 DIAGNOSIS — T161XXA Foreign body in right ear, initial encounter: Secondary | ICD-10-CM

## 2022-11-29 DIAGNOSIS — H60501 Unspecified acute noninfective otitis externa, right ear: Secondary | ICD-10-CM

## 2022-11-29 MED ORDER — NEOMYCIN-POLYMYXIN-HC 3.5-10000-1 OT SUSP: 4.0000 [drp] | Freq: Four times a day (QID) | OTIC | 0 refills | Status: AC

## 2022-11-29 NOTE — ED Triage Notes (Signed)
Pt reports she has a piece of  ear plug broke off in right ear 3 days ago. Pt she had wax, she was not able to wash out.Pt feels fullness sensation and hearing decrease in right era.

## 2022-11-29 NOTE — ED Provider Notes (Signed)
[facility]    CSN: [id number] Arrival date & time: 11/29/22  0840      History   Chief Complaint Chief Complaint  Patient presents with   Ear Injury    I think a piece of ear plug broke off in canal on Monday afternoon.  Just realized that today. Thought it was wax but unable to wash out. - Entered by patient   Appointment    0900    HPI Amy Finley is a 82 y.o. female.   Patient presents today for a piece of earplug in her right ear.  Reports she was mowing the grass on Monday and had her earplugs in.  Reports after mowing the grass, she felt her earring in her right ear is muffled.  She tried to use a Q-tip as well as tried multiple flushes to remove the plugged sensation without much benefit.  She then noticed that the tip of her blue ear plug was broken off and she has the blue ear plug with her today.  She describes fullness in the ear, however no current pain.  Reports there was some pain in the ear after using Q-tips in it.  No ear drainage, fever, recent cough, congestion, or sore throat.    Past Medical History:  Diagnosis Date   Cervical cancer ([place])    remote history at age 89   Coronary artery disease    Myocardial infarction ST segment elevation treated with PCI and stenting of her apical LAD complicated by left main dissection requiring emergency CABG x 2 04/07/10   Hyperlipidemia, mild    statin intolerant   Ovarian cancer [facility])     Patient Active Problem List   Diagnosis Date Noted   Choledocholithiasis with acute cholecystitis 06/13/2022   Abdominal pain 06/13/2022   Nausea & vomiting 06/13/2022   Hypokalemia 06/13/2022   Elevated troponin 06/13/2022   Prolonged QT interval 06/13/2022   Positive FIT (fecal immunochemical test) 02/10/2018   Chest pain 05/31/2017   Hyperlipidemia 06/22/2014   CAD (coronary artery disease) 02/13/2013   CLOSED FRACTURE OF OTHER BONE OF WRIST 03/08/2010    Past Surgical History:  Procedure Laterality  Date   CARDIAC CATHETERIZATION  04/07/10     1.5 x 12 Flex balloon angioplasty.  angioplasty performed in the entire distal third ever restoring apical flow.  2.0 x 18 mini Vision stent deployed.  Dissection in proximal LAD extending back into left main and cirumflex.   CATARACT EXTRACTION W/PHACO Left 03/29/2015   Procedure: CATARACT EXTRACTION PHACO AND INTRAOCULAR LENS PLACEMENT;;  Surgeon: Williams Che, MD;  Location: [facility];  Service: Ophthalmology;  Laterality: Left;  CDE 7.86   CATARACT EXTRACTION W/PHACO Right 08/01/2015   Procedure: CATARACT EXTRACTION PHACO AND INTRAOCULAR LENS PLACEMENT (IOC);  Surgeon: Williams Che, MD;  Location: [facility];  Service: Ophthalmology;  Laterality: Right;  CDE: 7.96   CHOLECYSTECTOMY N/A 06/15/2022   Procedure: LAPAROSCOPIC CHOLECYSTECTOMY WITH INTRAOPERATIVE CHOLANGIOGRAM;  Surgeon: Kieth Brightly Arta Bruce, MD;  Location: [facility];  Service: General;  Laterality: N/A;   COLONOSCOPY N/A 03/10/2018   Procedure: COLONOSCOPY;  Surgeon: Rogene Houston, MD;  [facility];  Service: Endoscopy;  Laterality: N/A;  1:25   CORONARY ARTERY BYPASS GRAFT  04/07/10   (emergency)  x2 using saphenous vein graft to mid left anterior descending coronary artery, saphenous vein graft to obtuse marginal branch and left circumflex coronary artery    ORIF WRIST FRACTURE Left 2011   POLYPECTOMY  03/10/2018   Procedure: POLYPECTOMY;  Surgeon: Rogene Houston, MD;  [facility];  Service: Endoscopy;;  colon    TOTAL ABDOMINAL HYSTERECTOMY  1976    OB History   No obstetric history on file.      Home Medications    Prior to Admission medications   Medication Sig Start Date End Date Taking? Authorizing Provider  neomycin-polymyxin-hydrocortisone (CORTISPORIN) 3.5-10000-1 OTIC suspension Place 4 drops into the right ear 4 (four) times daily for 7 days. 11/29/22 12/06/22 Yes Eulogio Bear, NP  aspirin EC 81 MG tablet Take 81 mg by mouth daily.  Swallow whole.    [provider]  b complex vitamins tablet Take 1 tablet by mouth daily.     [provider]  chlorpheniramine (CHLOR-TRIMETON) 4 MG tablet Take 4 mg by mouth daily.    [provider]  cholecalciferol (VITAMIN D3) 25 MCG (1000 UNIT) tablet Take 1,000 Units by mouth daily.    [provider]  DHA-EPA-Flaxseed Oil-Vitamin E (THERA TEARS NUTRITION PO) Place 1 drop into both eyes daily.    [provider]  melatonin 5 MG TABS Take 5 mg by mouth at bedtime as needed (sleep).    [provider]  oxyCODONE (OXY IR/ROXICODONE) 5 MG immediate release tablet Take 1 tablet (5 mg total) by mouth every 4 (four) hours as needed for moderate pain or severe pain. 06/16/22   Rolm Bookbinder, MD  QUERCETIN PO Take 800 mg by mouth daily.     [provider]  cromolyn (NASALCROM) 5.2 MG/ACT nasal spray Place 1 spray into both nostrils 2 (two) times daily.   04/02/20  [provider]    Family History Family History  Problem Relation Age of Onset   Cancer Mother    Diabetes Mother    Congestive Heart Failure Father     Social History Social History   Tobacco Use   Smoking status: Former    Packs/day: 1.50    Years: 50.00    Total pack years: 75.00    Types: Cigarettes    Quit date: 04/07/2010    Years since quitting: 12.6   Smokeless tobacco: Never  Vaping Use   Vaping Use: Never used  Substance Use Topics   Alcohol use: No   Drug use: No     Allergies   Erythromycin   Review of Systems Review of Systems Per HPI  Physical Exam Triage Vital Signs ED Triage Vitals  Enc Vitals Group     BP 11/29/22 0915 (!) 148/70     Pulse Rate 11/29/22 0915 79     Resp 11/29/22 0915 16     Temp 11/29/22 0915 97.8 F (36.6 C)     Temp Source 11/29/22 0915 Oral     SpO2 11/29/22 0915 96 %     Weight --      Height --      Head Circumference --      Peak Flow --      Pain Score 11/29/22 0914 3     Pain Loc  --      Pain Edu? --      Excl. in [place]? --    No data found.  Updated Vital Signs BP (!) 148/70 (BP Location: Right Arm)   Pulse 79   Temp 97.8 F (36.6 C) (Oral)   Resp 16   SpO2 96%   Visual Acuity Right Eye Distance:   Left Eye Distance:   Bilateral Distance:  Right Eye Near:   Left Eye Near:    Bilateral Near:     Physical Exam Vitals and nursing note reviewed.  Constitutional:      General: She is not in acute distress.    Appearance: Normal appearance. She is not toxic-appearing.  HENT:     Head: Normocephalic and atraumatic.     Right Ear: A foreign body (superficial blue foreign body noted to right external auditory canal) is present.     Left Ear: Tympanic membrane, ear canal and external ear normal. No swelling or tenderness.  No middle ear effusion. There is no impacted cerumen. Tympanic membrane is not perforated, erythematous, retracted or bulging.     Mouth/Throat:     Mouth: Mucous membranes are moist.     Pharynx: Oropharynx is clear. No oropharyngeal exudate or posterior oropharyngeal erythema.  Pulmonary:     Effort: Pulmonary effort is normal. No respiratory distress.  Skin:    General: Skin is warm and dry.     Coloration: Skin is not jaundiced or pale.     Findings: No erythema or rash.  Neurological:     Mental Status: She is alert and oriented to person, place, and time.  Psychiatric:        Behavior: Behavior is cooperative.      UC Treatments / Results  Labs (all labs ordered are listed, but only abnormal results are displayed) Labs Reviewed - No data to display  EKG   Radiology No results found.  Procedures Foreign Body Removal  Date/Time: 11/29/2022 10:22 AM  Performed by: Eulogio Bear, NP Authorized by: Eulogio Bear, NP   Consent:    Consent obtained:  Verbal   Consent given by:  Patient   Risks, benefits, and alternatives were discussed: yes     Risks discussed:  Pain, incomplete removal and worsening of  condition   Alternatives discussed:  Referral Universal protocol:    Procedure explained and questions answered to patient or proxy's satisfaction: yes     Patient identity confirmed:  Verbally with patient Location:    Location:  Ear   Ear location:  R ear   Depth: superficial. Anesthesia:    Anesthesia method:  None Procedure details:    Removal mechanism:  Forceps (alligator forceps)   Foreign bodies recovered:  1   Description:  Blue, rubber   Intact foreign body removal: yes   Post-procedure details:    Confirmation:  No additional foreign bodies on visualization   Skin closure:  None   Dressing:  Open (no dressing)   Procedure completion:  Tolerated well, no immediate complications  (including critical care time)  Medications Ordered in UC Medications - No data to display  Initial Impression / Assessment and Plan / UC Course  I have reviewed the triage vital signs and the nursing notes.  Pertinent labs & imaging results that were available during my care of the patient were reviewed by me and considered in my medical decision making (see chart for details).   Patient is well-appearing, normotensive, afebrile, not tachycardic, not tachypneic, oxygenating well on room air.    1. Foreign body of right ear, initial encounter Foreign body removed with alligator forceps as discussed  2. Acute otitis externa of right ear, unspecified type TM intact after removal; external auditory canal slightly edematous and erythematous, will cover with cortisporin Ear precautions discussed   The patient was given the opportunity to ask questions.  All questions answered to their satisfaction.  The patient is in agreement to this plan.    Final Clinical Impressions(s) / UC Diagnoses   Final diagnoses:  Foreign body of right ear, initial encounter  Acute otitis externa of right ear, unspecified type     Discharge Instructions      We were able to remove the broken off ear plug from  your ear today.  Please start the ear drops to treat the redness in the skin in your ear canal and to prevent infection.   ED Prescriptions     Medication Sig Dispense Auth. Provider   neomycin-polymyxin-hydrocortisone (CORTISPORIN) 3.5-10000-1 OTIC suspension Place 4 drops into the right ear 4 (four) times daily for 7 days. 10 mL Eulogio Bear, NP      PDMP not reviewed this encounter.   Eulogio Bear, NP 11/29/22 1026

## 2022-11-29 NOTE — Discharge Instructions (Addendum)
We were able to remove the broken off ear plug from your ear today.  Please start the ear drops to treat the redness in the skin in your ear canal and to prevent infection.

## 2023-01-31 ENCOUNTER — Encounter (INDEPENDENT_AMBULATORY_CARE_PROVIDER_SITE_OTHER): Payer: Self-pay | Admitting: *Deleted

## 2023-04-02 ENCOUNTER — Other Ambulatory Visit (HOSPITAL_COMMUNITY): Payer: Self-pay | Admitting: Internal Medicine

## 2023-04-02 DIAGNOSIS — Z1231 Encounter for screening mammogram for malignant neoplasm of breast: Secondary | ICD-10-CM

## 2023-04-04 ENCOUNTER — Encounter (HOSPITAL_COMMUNITY): Payer: Self-pay | Admitting: Internal Medicine

## 2023-04-04 ENCOUNTER — Ambulatory Visit (HOSPITAL_COMMUNITY)
Admission: RE | Admit: 2023-04-04 | Discharge: 2023-04-04 | Disposition: A | Discharge status: Home / Self Care | Payer: Medicare Other | Source: Ambulatory Visit | Attending: Internal Medicine | Admitting: Internal Medicine

## 2023-04-04 DIAGNOSIS — Z1231 Encounter for screening mammogram for malignant neoplasm of breast: Secondary | ICD-10-CM | POA: Diagnosis present

## 2023-09-15 ENCOUNTER — Ambulatory Visit (INDEPENDENT_AMBULATORY_CARE_PROVIDER_SITE_OTHER): Payer: Medicare Other

## 2023-09-15 ENCOUNTER — Ambulatory Visit
Admission: EM | Admit: 2023-09-15 | Discharge: 2023-09-15 | Disposition: A | Discharge status: Home / Self Care | Payer: Medicare Other | Attending: Nurse Practitioner | Admitting: Nurse Practitioner

## 2023-09-15 DIAGNOSIS — J441 Chronic obstructive pulmonary disease with (acute) exacerbation: Secondary | ICD-10-CM

## 2023-09-15 DIAGNOSIS — R062 Wheezing: Secondary | ICD-10-CM | POA: Diagnosis not present

## 2023-09-15 DIAGNOSIS — R059 Cough, unspecified: Secondary | ICD-10-CM

## 2023-09-15 MED ORDER — GUAIFENESIN 100 MG/5ML PO LIQD: 10.0000 mL | Freq: Four times a day (QID) | ORAL | 0 refills | Status: AC | PRN

## 2023-09-15 MED ORDER — ALBUTEROL SULFATE HFA 108 (90 BASE) MCG/ACT IN AERS: 2.0000 | INHALATION_SPRAY | Freq: Four times a day (QID) | RESPIRATORY_TRACT | 0 refills | Status: AC | PRN

## 2023-09-15 MED ORDER — AMOXICILLIN-POT CLAVULANATE 875-125 MG PO TABS: 1.0000 | ORAL_TABLET | Freq: Two times a day (BID) | ORAL | 0 refills | Status: AC

## 2023-09-15 MED ORDER — PREDNISONE 20 MG PO TABS: 40.0000 mg | ORAL_TABLET | Freq: Every day | ORAL | 0 refills | Status: AC

## 2023-09-15 MED ORDER — IPRATROPIUM-ALBUTEROL 0.5-2.5 (3) MG/3ML IN SOLN
3.0000 mL | Freq: Once | RESPIRATORY_TRACT | Status: AC
  Administered 2023-09-15: 3 mL via RESPIRATORY_TRACT

## 2023-09-15 NOTE — ED Provider Notes (Signed)
[facility]    CSN: [id number] Arrival date & time: 09/15/23  0845      History   Chief Complaint No chief complaint on file.   HPI Amy Finley is a 82 y.o. female.   The history is provided by the patient.   Patient presents for complaints of cough that started over the past several days.  Patient states at the beginning of the week, she developed flulike symptoms after taking a family member to the hospital.  She states she did have fever around 100 at that time, along with bodyaches, and chills, but those symptoms have since improved.  Patient states over the past 48 hours, she developed a cough.  She states that she has had wheezing, and some mild shortness of breath.  She denies headache, difficulty breathing, chest pain, abdominal pain, nausea, vomiting, diarrhea, or rash.  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  She reports she has been taking over-the-counter cough and cold medications for her symptoms with minimal relief.  Past Medical History:  Diagnosis Date   Cervical cancer ([facility])    remote history at age 75   Coronary artery disease    Myocardial infarction ST segment elevation treated with PCI and stenting of her apical LAD complicated by left main dissection requiring emergency CABG x 2 04/07/10   Hyperlipidemia, mild    statin intolerant   Ovarian cancer [facility])     Patient Active Problem List   Diagnosis Date Noted   Choledocholithiasis with acute cholecystitis 06/13/2022   Abdominal pain 06/13/2022   Nausea & vomiting 06/13/2022   Hypokalemia 06/13/2022   Elevated troponin 06/13/2022   Prolonged QT interval 06/13/2022   Positive FIT (fecal immunochemical test) 02/10/2018   Chest pain 05/31/2017   Hyperlipidemia 06/22/2014   CAD (coronary artery disease) 02/13/2013   CLOSED FRACTURE OF OTHER BONE OF WRIST 03/08/2010    Past Surgical History:  Procedure Laterality Date    CARDIAC CATHETERIZATION  04/07/10     1.5 x 12 Flex balloon angioplasty.  angioplasty performed in the entire distal third ever restoring apical flow.  2.0 x 18 mini Vision stent deployed.  Dissection in proximal LAD extending back into left main and cirumflex.   CATARACT EXTRACTION W/PHACO Left 03/29/2015   Procedure: CATARACT EXTRACTION PHACO AND INTRAOCULAR LENS PLACEMENT;;  Surgeon: Susa Simmonds, MD;  Location: [facility];  Service: Ophthalmology;  Laterality: Left;  CDE 7.86   CATARACT EXTRACTION W/PHACO Right 08/01/2015   Procedure: CATARACT EXTRACTION PHACO AND INTRAOCULAR LENS PLACEMENT (IOC);  Surgeon: Susa Simmonds, MD;  Location: [facility];  Service: Ophthalmology;  Laterality: Right;  CDE: 7.96   CHOLECYSTECTOMY N/A 06/15/2022   Procedure: LAPAROSCOPIC CHOLECYSTECTOMY WITH INTRAOPERATIVE CHOLANGIOGRAM;  Surgeon: Sheliah Hatch De Blanch, MD;  Location: [facility];  Service: General;  Laterality: N/A;   COLONOSCOPY N/A 03/10/2018   Procedure: COLONOSCOPY;  Surgeon: Malissa Hippo, MD;  [facility];  Service: Endoscopy;  Laterality: N/A;  1:25   CORONARY ARTERY BYPASS GRAFT  04/07/10   (emergency)  x2 using saphenous vein graft to mid left anterior descending coronary artery, saphenous vein graft to obtuse marginal branch and left circumflex coronary artery    ORIF WRIST FRACTURE Left 2011   POLYPECTOMY  03/10/2018   Procedure: POLYPECTOMY;  Surgeon: Malissa Hippo, MD;  [facility];  Service: Endoscopy;;  colon    TOTAL ABDOMINAL HYSTERECTOMY  1976  OB History   No obstetric history on file.      Home Medications    Prior to Admission medications   Medication Sig Start Date End Date Taking? Authorizing Provider  albuterol (VENTOLIN HFA) 108 (90 Base) MCG/ACT inhaler Inhale 2 puffs into the lungs every 6 (six) hours as needed. 09/15/23  Yes Leath-Warren, Sadie Haber, NP  amoxicillin-clavulanate (AUGMENTIN) 875-125 MG tablet Take 1 tablet by mouth every 12  (twelve) hours for 5 days. 09/15/23 09/20/23 Yes Leath-Warren, Sadie Haber, NP  guaiFENesin (ROBITUSSIN) 100 MG/5ML liquid Take 10 mLs by mouth every 6 (six) hours as needed for cough or to loosen phlegm. 09/15/23  Yes Leath-Warren, Sadie Haber, NP  predniSONE (DELTASONE) 20 MG tablet Take 2 tablets (40 mg total) by mouth daily with breakfast for 5 days. 09/15/23 09/20/23 Yes Leath-Warren, Sadie Haber, NP  aspirin EC 81 MG tablet Take 81 mg by mouth daily. Swallow whole.    [provider]  b complex vitamins tablet Take 1 tablet by mouth daily.     [provider]  chlorpheniramine (CHLOR-TRIMETON) 4 MG tablet Take 4 mg by mouth daily.    [provider]  cholecalciferol (VITAMIN D3) 25 MCG (1000 UNIT) tablet Take 1,000 Units by mouth daily.    [provider]  DHA-EPA-Flaxseed Oil-Vitamin E (THERA TEARS NUTRITION PO) Place 1 drop into both eyes daily.    [provider]  melatonin 5 MG TABS Take 5 mg by mouth at bedtime as needed (sleep).    [provider]  oxyCODONE (OXY IR/ROXICODONE) 5 MG immediate release tablet Take 1 tablet (5 mg total) by mouth every 4 (four) hours as needed for moderate pain or severe pain. 06/16/22   Emelia Loron, MD  QUERCETIN PO Take 800 mg by mouth daily.     [provider]  cromolyn (NASALCROM) 5.2 MG/ACT nasal spray Place 1 spray into both nostrils 2 (two) times daily.   04/02/20  [provider]    Family History Family History  Problem Relation Age of Onset   Cancer Mother    Diabetes Mother    Congestive Heart Failure Father     Social History Social History   Tobacco Use   Smoking status: Former    Current packs/day: 0.00    Average packs/day: 1.5 packs/day for 50.0 years (75.0 ttl pk-yrs)    Types: Cigarettes    Start date: 04/07/1960    Quit date: 04/07/2010    Years since quitting: 13.4   Smokeless tobacco: Never  Vaping Use   Vaping status: Never Used  Substance Use  Topics   Alcohol use: No   Drug use: No     Allergies   Erythromycin   Review of Systems Review of Systems Per HPI  Physical Exam Triage Vital Signs ED Triage Vitals  Encounter Vitals Group     BP 09/15/23 0910 (!) 160/88     Systolic BP Percentile --      Diastolic BP Percentile --      Pulse Rate 09/15/23 0910 97     Resp 09/15/23 0910 18     Temp 09/15/23 0910 98.2 F (36.8 C)     Temp Source 09/15/23 0910 Oral     SpO2 09/15/23 0910 91 %     Weight --      Height --      Head Circumference --      Peak Flow --      Pain Score 09/15/23 0914 0  Pain Loc --      Pain Education --      Exclude from Growth Chart --    No data found.  Updated Vital Signs BP (!) 160/88 (BP Location: Right Arm)   Pulse 93   Temp 98.2 F (36.8 C) (Oral)   Resp 18   SpO2 96%   Visual Acuity Right Eye Distance:   Left Eye Distance:   Bilateral Distance:    Right Eye Near:   Left Eye Near:    Bilateral Near:     Physical Exam Vitals and nursing note reviewed.  Constitutional:      Appearance: Normal appearance.  HENT:     Head: Normocephalic.     Right Ear: Tympanic membrane, ear canal and external ear normal.     Left Ear: Tympanic membrane, ear canal and external ear normal.     Nose: Nose normal.     Right Turbinates: Enlarged and swollen.     Left Turbinates: Enlarged and swollen.     Right Sinus: No maxillary sinus tenderness or frontal sinus tenderness.     Left Sinus: No maxillary sinus tenderness or frontal sinus tenderness.     Mouth/Throat:     Lips: Pink.     Mouth: Mucous membranes are moist.     Pharynx: Oropharynx is clear. Uvula midline. Postnasal drip present. No pharyngeal swelling, oropharyngeal exudate, posterior oropharyngeal erythema or uvula swelling.     Comments: Cobblestoning present to posterior oropharynx  Eyes:     Extraocular Movements: Extraocular movements intact.     Conjunctiva/sclera: Conjunctivae normal.     Pupils: Pupils are  equal, round, and reactive to light.  Cardiovascular:     Rate and Rhythm: Normal rate and regular rhythm.     Pulses: Normal pulses.     Heart sounds: Normal heart sounds.  Pulmonary:     Effort: Pulmonary effort is normal. No respiratory distress.     Breath sounds: No stridor. Wheezing (expiratory wheezing noted in posterior upper and lower lung fields) present. No rhonchi or rales.     Comments: DuoNeb administered, patient reports good improvement of breathing and wheezing post nebulizer treatment. Abdominal:     General: Bowel sounds are normal.     Palpations: Abdomen is soft.     Tenderness: There is no abdominal tenderness.  Musculoskeletal:     Cervical back: Normal range of motion.  Lymphadenopathy:     Cervical: No cervical adenopathy.  Skin:    General: Skin is warm and dry.  Neurological:     General: No focal deficit present.     Mental Status: She is alert and oriented to person, place, and time.  Psychiatric:        Mood and Affect: Mood normal.        Behavior: Behavior normal.      UC Treatments / Results  Labs (all labs ordered are listed, but only abnormal results are displayed) Labs Reviewed - No data to display  EKG   Radiology DG Chest 2 View Result Date: 09/15/2023 CLINICAL DATA:  Cough and wheezing.  Former smoker. EXAM: CHEST - 2 VIEW COMPARISON:  06/13/2022 FINDINGS: Previous median sternotomy. The lungs are hyperinflated there is central airway thickening. No pleural fluid, interstitial edema or airspace consolidation. No acute osseous findings. IMPRESSION: Hyperinflation and central airway thickening compatible with bronchitis or obstructive pulmonary disease. Electronically Signed   By: Signa Kell M.D.   On: 09/15/2023 10:09    Procedures Procedures (including  critical care time)  Medications Ordered in UC Medications  ipratropium-albuterol (DUONEB) 0.5-2.5 (3) MG/3ML nebulizer solution 3 mL (3 mLs Nebulization Given 09/15/23 0956)     Initial Impression / Assessment and Plan / UC Course  I have reviewed the triage vital signs and the nursing notes.  Pertinent labs & imaging results that were available during my care of the patient were reviewed by me and considered in my medical decision making (see chart for details).  DuoNeb was administered with good relief of the patient's symptoms.  X-ray is negative for pneumonia; however it does show findings consistent with acute bronchitis versus COPD.  Will start patient on prednisone 40 mg for the next 5 days, along with providing an albuterol inhaler, and Robitussin 100 mg for cough.  Will also cover patient for COPD exacerbation with Augmentin 875/125 mg twice daily for the next 5 days.  Supportive care recommendations were provided and discussed with the patient to include fluids, rest, over-the-counter analgesics, and use of a humidifier at nighttime during sleep.  Patient was given ER follow-up precautions.  Patient was in agreement with this plan of care and verbalizes understanding.  All questions were answered.  Patient stable for discharge.   Final Clinical Impressions(s) / UC Diagnoses   Final diagnoses:  Cough, unspecified type  Wheezing  COPD exacerbation [facility])     Discharge Instructions      Your chest x-ray is negative for pneumonia.  Your symptoms are consistent with your COPD. Take medication as prescribed. Increase fluids and allow for plenty of rest. May use normal saline nasal spray throughout the day as needed for nasal congestion and runny nose. Recommend using a humidifier in your bedroom at nighttime during sleep and sleeping slightly elevated on pillows while cough symptoms persist. If symptoms do not improve with this treatment, please follow-up with your primary care physician for further evaluation. Go to the emergency department immediately for worsening wheezing, shortness of breath, difficulty breathing, or other concerns. Follow-up as  needed.     ED Prescriptions     Medication Sig Dispense Auth. Provider   predniSONE (DELTASONE) 20 MG tablet Take 2 tablets (40 mg total) by mouth daily with breakfast for 5 days. 10 tablet Leath-Warren, Sadie Haber, NP   albuterol (VENTOLIN HFA) 108 (90 Base) MCG/ACT inhaler Inhale 2 puffs into the lungs every 6 (six) hours as needed. 8 g Leath-Warren, Sadie Haber, NP   guaiFENesin (ROBITUSSIN) 100 MG/5ML liquid Take 10 mLs by mouth every 6 (six) hours as needed for cough or to loosen phlegm. 300 mL Leath-Warren, Sadie Haber, NP   amoxicillin-clavulanate (AUGMENTIN) 875-125 MG tablet Take 1 tablet by mouth every 12 (twelve) hours for 5 days. 10 tablet Leath-Warren, Sadie Haber, NP      PDMP not reviewed this encounter.   Abran Cantor, NP 09/15/23 1021

## 2023-09-15 NOTE — ED Triage Notes (Signed)
Pt reports cough, congestion, wheezing. Pt states last Sunday, she took a family member to the hospital was positive for flu sx's started on Tuesday and subsided, then URI sx's started by Friday.

## 2023-09-15 NOTE — Discharge Instructions (Addendum)
Your chest x-ray is negative for pneumonia.  Your symptoms are consistent with your COPD. Take medication as prescribed. Increase fluids and allow for plenty of rest. May use normal saline nasal spray throughout the day as needed for nasal congestion and runny nose. Recommend using a humidifier in your bedroom at nighttime during sleep and sleeping slightly elevated on pillows while cough symptoms persist. If symptoms do not improve with this treatment, please follow-up with your primary care physician for further evaluation. Go to the emergency department immediately for worsening wheezing, shortness of breath, difficulty breathing, or other concerns. Follow-up as needed.

## 2024-04-14 ENCOUNTER — Other Ambulatory Visit (HOSPITAL_COMMUNITY): Payer: Self-pay | Admitting: Internal Medicine

## 2024-04-14 DIAGNOSIS — Z1231 Encounter for screening mammogram for malignant neoplasm of breast: Secondary | ICD-10-CM

## 2024-04-16 ENCOUNTER — Encounter (HOSPITAL_COMMUNITY): Payer: Self-pay

## 2024-04-16 ENCOUNTER — Ambulatory Visit (HOSPITAL_COMMUNITY)
Admission: RE | Admit: 2024-04-16 | Discharge: 2024-04-16 | Disposition: A | Discharge status: Home / Self Care | Source: Ambulatory Visit | Attending: Internal Medicine | Admitting: Internal Medicine

## 2024-04-16 DIAGNOSIS — Z1231 Encounter for screening mammogram for malignant neoplasm of breast: Secondary | ICD-10-CM | POA: Diagnosis present
